# Patient Record
Sex: Male | Born: 1985 | Race: White | Hispanic: No | Marital: Married | Smoking: Never smoker
Health system: Southern US, Community
[De-identification: ages and names within clinical notes are randomized; demographics above are authoritative.]

## PROBLEM LIST (undated history)

## (undated) DIAGNOSIS — Z8042 Family history of malignant neoplasm of prostate: Secondary | ICD-10-CM

## (undated) DIAGNOSIS — M109 Gout, unspecified: Secondary | ICD-10-CM

## (undated) DIAGNOSIS — J45909 Unspecified asthma, uncomplicated: Secondary | ICD-10-CM

## (undated) DIAGNOSIS — E669 Obesity, unspecified: Secondary | ICD-10-CM

## (undated) DIAGNOSIS — E785 Hyperlipidemia, unspecified: Secondary | ICD-10-CM

## (undated) HISTORY — DX: Family history of malignant neoplasm of prostate: Z80.42

## (undated) HISTORY — DX: Unspecified asthma, uncomplicated: J45.909

## (undated) HISTORY — DX: Gout, unspecified: M10.9

## (undated) HISTORY — DX: Hyperlipidemia, unspecified: E78.5

## (undated) HISTORY — DX: Obesity, unspecified: E66.9

---
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
## 2015-09-29 ENCOUNTER — Ambulatory Visit: Payer: Self-pay | Admitting: Family

## 2015-09-30 ENCOUNTER — Ambulatory Visit: Payer: Self-pay | Admitting: Family

## 2017-09-12 ENCOUNTER — Encounter: Payer: Self-pay | Admitting: Family Medicine

## 2017-09-12 ENCOUNTER — Ambulatory Visit (INDEPENDENT_AMBULATORY_CARE_PROVIDER_SITE_OTHER): Payer: Managed Care, Other (non HMO) | Admitting: Family Medicine

## 2017-09-12 VITALS — BP 132/86 | HR 80 | Temp 98.7°F | Resp 16 | Ht 71.5 in | Wt 245.2 lb

## 2017-09-12 DIAGNOSIS — M779 Enthesopathy, unspecified: Secondary | ICD-10-CM

## 2017-09-12 DIAGNOSIS — Z23 Encounter for immunization: Secondary | ICD-10-CM | POA: Diagnosis not present

## 2017-09-12 DIAGNOSIS — Z0000 Encounter for general adult medical examination without abnormal findings: Secondary | ICD-10-CM

## 2017-09-12 DIAGNOSIS — M7752 Other enthesopathy of left foot: Secondary | ICD-10-CM

## 2017-09-12 DIAGNOSIS — M79675 Pain in left toe(s): Secondary | ICD-10-CM | POA: Diagnosis not present

## 2017-09-12 DIAGNOSIS — E669 Obesity, unspecified: Secondary | ICD-10-CM

## 2017-09-12 MED ORDER — INDOMETHACIN 50 MG PO CAPS: 50.0000 mg | ORAL_CAPSULE | Freq: Three times a day (TID) | ORAL | 1 refills | Status: DC | PRN

## 2017-09-12 NOTE — Progress Notes (Signed)
Office Note 09/12/2017  CC:  Chief Complaint  Patient presents with  . Establish Care    no recent PCP  . Toe Pain    left great toe    HPI:  Dean Thomas is a 32 y.o. male who is here to establish care and discuss L great toe pain. Patient's most recent primary MD: none Old records were not reviewed prior to or during today's visit.  Last CPE was about 1 yr ago.  Labs and CPE needed for pt's health ins.  About 5 distinct episodes in the last 1 yr of L great toe pain in IP joint.  When he does a lot of exercises involving extension of toe, esp lunges--he gets some swelling and pain and some redness in IP joint.  This episode has been going on for 2 wks or so, gradually improving. Pain at rest is minimal right now. Hurts worse when he gets up and walks around. No other joint has given him similar problem.  Past Medical History:  Diagnosis Date  . Childhood asthma   . Family history of prostate cancer in father   . Hyperlipidemia    no meds in past.  . Obesity (BMI 30.0-34.9)     Past Surgical History:  Procedure Laterality Date  . CYST EXCISION  2004   Pilonidal    Family History  Problem Relation Age of Onset  . Prostate cancer Father   . Hearing loss Father   . Hyperlipidemia Father   . Hypertension Father   . Hyperlipidemia Maternal Grandmother   . Arthritis Paternal Grandmother   . Colon cancer Paternal Grandmother     Social History   Socioeconomic History  . Marital status: Married    Spouse name: Not on file  . Number of children: Not on file  . Years of education: Not on file  . Highest education level: Not on file  Occupational History  . Not on file  Social Needs  . Financial resource strain: Not on file  . Food insecurity:    Worry: Not on file    Inability: Not on file  . Transportation needs:    Medical: Not on file    Non-medical: Not on file  Tobacco Use  . Smoking status: Never Smoker  . Smokeless tobacco: Never Used   Substance and Sexual Activity  . Alcohol use: Yes    Alcohol/week: 4.0 - 5.0 standard drinks    Types: 4 - 5 Standard drinks or equivalent per week  . Drug use: Never  . Sexual activity: Not on file  Lifestyle  . Physical activity:    Days per week: Not on file    Minutes per session: Not on file  . Stress: Not on file  Relationships  . Social connections:    Talks on phone: Not on file    Gets together: Not on file    Attends religious service: Not on file    Active member of club or organization: Not on file    Attends meetings of clubs or organizations: Not on file    Relationship status: Not on file  . Intimate partner violence:    Fear of current or ex partner: Not on file    Emotionally abused: Not on file    Physically abused: Not on file    Forced sexual activity: Not on file  Other Topics Concern  . Not on file  Social History Narrative   Married, 1 y/o daughter as of 09/2017.  Orig from [place].   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   No tobacco.   Alcohol:    Outpatient Encounter Medications as of 09/12/2017  Medication Sig  . indomethacin (INDOCIN) 50 MG capsule Take 1 capsule (50 mg total) by mouth 3 (three) times daily as needed.   No facility-administered encounter medications on file as of 09/12/2017.     No Known Allergies  ROS Review of Systems  Constitutional: Negative for fatigue and fever.  HENT: Negative for congestion and sore throat.   Eyes: Negative for visual disturbance.  Respiratory: Negative for cough.   Cardiovascular: Negative for chest pain.  Gastrointestinal: Negative for abdominal pain and nausea.  Genitourinary: Negative for dysuria.  Musculoskeletal: Positive for arthralgias (see hpi) and joint swelling. Negative for back pain and myalgias.  Skin: Negative for rash.  Neurological: Negative for weakness and headaches.  Hematological: Negative for adenopathy.    PE; Blood pressure 132/86,  pulse 80, temperature 98.7 F (37.1 C), temperature source Oral, resp. rate 16, height 5' 11.5" (1.816 m), weight 245 lb 4 oz (111.2 kg), SpO2 99 %. Body mass index is 33.73 kg/m.  Gen: Alert, well appearing.  Patient is oriented to person, place, time, and situation. AFFECT: pleasant, lucid thought and speech. CV: RRR, no m/r/g.   LUNGS: CTA bilat, nonlabored resps, good aeration in all lung fields. EXT: no clubbing or cyanosis.  no edema.  Left ankle and foot w/out swelling, erythema, or warmth. Left great toe with mild erythema of IP joint lateral aspect as well as some mild STS in IP joint.  Mild TTP over IP joint of great toe.  ROM of L great toe somewhat decreased in flexion/ext at IP joint.    Pertinent labs:  none  ASSESSMENT AND PLAN:   New pt: no old PCP records to obtain.  1) Recurrent L toe IP joint pain: capsulitis vs gouty arthritis. Given all info today, I feel like this is more likely capsulitis. Activity mod, ice, elevation, and start of trial of indocin 50mg  tid (with food) discussed. Will check uric acid when he returns for fasting HP labs at the time of his CPE soon (ordered).  An After Visit Summary was printed and given to the patient.  Return for CPE (fasting) at pt's convenience.  Signed:  Santiago Bumpers, MD           09/12/2017

## 2017-09-12 NOTE — Addendum Note (Signed)
Addended by: Smitty Knudsen on: 09/12/2017 02:56 PM   Modules accepted: Orders

## 2017-09-26 ENCOUNTER — Encounter: Payer: Self-pay | Admitting: Family Medicine

## 2017-09-27 ENCOUNTER — Encounter: Payer: Self-pay | Admitting: Family Medicine

## 2018-03-06 ENCOUNTER — Other Ambulatory Visit: Payer: Self-pay | Admitting: Physician Assistant

## 2018-03-06 ENCOUNTER — Telehealth: Payer: Self-pay | Admitting: Physician Assistant

## 2018-03-06 MED ORDER — AMOXICILLIN 875 MG PO TABS: 875.0000 mg | ORAL_TABLET | Freq: Two times a day (BID) | ORAL | 0 refills | Status: DC

## 2018-03-06 NOTE — Telephone Encounter (Signed)
Patient's wife in office today and diagnosed with strep throat.  Daughter at home with similar symptoms.  I have sent in amoxicillin 875 mg BID x 10 days for patient.  Advised to contact office if any concerning symptoms or changes in symptoms.  Jarold Motto PA-C

## 2018-06-11 ENCOUNTER — Telehealth: Payer: Self-pay

## 2018-06-11 ENCOUNTER — Ambulatory Visit (INDEPENDENT_AMBULATORY_CARE_PROVIDER_SITE_OTHER): Payer: Managed Care, Other (non HMO) | Admitting: Family Medicine

## 2018-06-11 ENCOUNTER — Encounter: Payer: Self-pay | Admitting: Family Medicine

## 2018-06-11 ENCOUNTER — Other Ambulatory Visit: Payer: Self-pay

## 2018-06-11 VITALS — Temp 97.8°F | Wt 229.0 lb

## 2018-06-11 DIAGNOSIS — M10071 Idiopathic gout, right ankle and foot: Secondary | ICD-10-CM | POA: Diagnosis not present

## 2018-06-11 MED ORDER — PREDNISONE 20 MG PO TABS: ORAL_TABLET | ORAL | 0 refills | Status: DC

## 2018-06-11 NOTE — Progress Notes (Signed)
Virtual Visit via Video Note  I connected with pt on 06/11/18 at 11:20 AM EDT by a video enabled telemedicine application and verified that I am speaking with the correct person using two identifiers.  Location patient: home Location provider:work or home office Persons participating in the virtual visit: patient, provider  I discussed the limitations of evaluation and management by telemedicine and the availability of in person appointments. The patient expressed understanding and agreed to proceed.  Telemedicine visit is a necessity given the COVID-19 restrictions in place at the current time.  HPI: 33 y/o WM being seen today for right foot pain for the last 7d. (He had been having L great toe PIP pain when I initially saw him back in 09/2017 and I wasn't sure whether he was having capsulitis or gout.  Treated with indomethacin.  I ordered a fasting HP labs + uric acid to be done at his CPE but he did not return for that visit.)  UPDATE: Severe R MTP joint pain onset 7 d/a, swelling, decreased ROM.  Very sensitive to palpation. Trying to rest it, hydrate well.  Remainder of foot and his ankle are not painful. Indocin not very helpful, sx's worse this morning.  No fever/chills/malaise. Good appetite and energy level. No other joints bother him.  No recent injury.  FH: brother with gout.   ROS: See pertinent positives and negatives per HPI.  Past Medical History:  Diagnosis Date  . Childhood asthma   . Family history of prostate cancer in father   . Hyperlipidemia    no meds in past.  . Obesity (BMI 30.0-34.9)     Past Surgical History:  Procedure Laterality Date  . CYST EXCISION  2004   Pilonidal    Family History  Problem Relation Age of Onset  . Prostate cancer Father   . Hearing loss Father   . Hyperlipidemia Father   . Hypertension Father   . Hyperlipidemia Maternal Grandmother   . Arthritis Paternal Grandmother   . 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 No T/A/Ds.   Current Outpatient Medications:  .  ibuprofen (ADVIL) 600 MG tablet, Take 600 mg by mouth every 6 (six) hours as needed., Disp: , Rfl:  .  indomethacin (INDOCIN) 50 MG capsule, Take 1 capsule (50 mg total) by mouth 3 (three) times daily as needed., Disp: 30 capsule, Rfl: 1  EXAM:  VITALS per patient if applicable: Temp 97.8 F (36.6 C) (Oral)   Wt 229 lb (103.9 kg)   BMI 31.49 kg/m    GENERAL: alert, oriented, appears well and in no acute distress  HEENT: atraumatic, conjunttiva clear, no obvious abnormalities on inspection of external nose and ears  NECK: normal movements of the head and neck  LUNGS: on inspection no signs of respiratory distress, breathing rate appears normal, no obvious gross SOB, gasping or wheezing  CV: no obvious cyanosis  MS: moves all visible extremities without noticeable abnormality except R foot 1st MTP joint with mild swelling visible.  No erythema but mild hyperpigmentation appearance over R foot first MTP jt.   PSYCH/NEURO: pleasant and cooperative, no obvious depression or anxiety, speech and thought processing grossly intact  LABS: none today  [facility] results found for: [facility]   Chemistry   No results found for: NA, K, CL, CO2, BUN, CREATININE, GLU No results found for: CALCIUM, ALKPHOS, AST, ALT, BILITOT   No results found for: WBC,  HGB, HCT, MCV, PLT  ASSESSMENT AND PLAN:  Discussed the following assessment and plan:  1) Acute gouty arthritis right MTP jt: Plan is-> prednisone 60mg  qd x 2d, then 40mg  qd x 2d, then 20mg  qd x 2d. No NSAIDs while on prednisone. Discussed approach to gout prevention with diet +/- daily medication. Pt expressed understanding and we chose to NOT start any prophylactic med at this time. Will check uric acid level at future CPE when we do fasting HP labs.  Signs/symptoms to call or return for were reviewed and pt expressed  understanding.   I discussed the assessment and treatment plan with the patient. The patient was provided an opportunity to ask questions and all were answered. The patient agreed with the plan and demonstrated an understanding of the instructions.   The patient was advised to call back or seek an in-person evaluation if the symptoms worsen or if the condition fails to improve as anticipated.  F/u: prn (CPE when he wants).  Signed:  Santiago Bumpers, MD           06/11/2018

## 2018-06-11 NOTE — Telephone Encounter (Signed)
Pt has been scheduled today for late morning with PCP.  Copied from CRM [id number]. Topic: Appointment Scheduling - Scheduling Inquiry for Clinic >> Jun 11, 2018  7:29 AM Leafy Ro wrote: Pt is having a gout flare up in right foot for 1 wk today and would like an appt

## 2018-07-15 ENCOUNTER — Telehealth: Payer: Self-pay

## 2018-07-15 NOTE — Telephone Encounter (Signed)
Pt was called and placed on Dr Isla Pence schedule.

## 2018-07-17 ENCOUNTER — Ambulatory Visit (INDEPENDENT_AMBULATORY_CARE_PROVIDER_SITE_OTHER): Payer: Managed Care, Other (non HMO) | Admitting: Family Medicine

## 2018-07-17 ENCOUNTER — Other Ambulatory Visit: Payer: Self-pay

## 2018-07-17 ENCOUNTER — Encounter: Payer: Self-pay | Admitting: Family Medicine

## 2018-07-17 VITALS — BP 124/85 | HR 76 | Temp 98.4°F | Resp 16 | Ht 71.5 in | Wt 235.2 lb

## 2018-07-17 DIAGNOSIS — M928 Other specified juvenile osteochondrosis: Secondary | ICD-10-CM | POA: Diagnosis not present

## 2018-07-17 DIAGNOSIS — M79674 Pain in right toe(s): Secondary | ICD-10-CM

## 2018-07-17 LAB — COMPREHENSIVE METABOLIC PANEL
ALT: 29 U/L (ref 0–53)
AST: 17 U/L (ref 0–37)
Albumin: 5.2 g/dL (ref 3.5–5.2)
Alkaline Phosphatase: 71 U/L (ref 39–117)
BUN: 15 mg/dL (ref 6–23)
CO2: 27 mEq/L (ref 19–32)
Calcium: 9.9 mg/dL (ref 8.4–10.5)
Chloride: 102 mEq/L (ref 96–112)
Creatinine, Ser: 0.95 mg/dL (ref 0.40–1.50)
GFR: 90.77 mL/min (ref >60.00)
Glucose, Bld: 99 mg/dL (ref 70–99)
Potassium: 4.8 mEq/L (ref 3.5–5.1)
Sodium: 141 mEq/L (ref 135–145)
Total Bilirubin: 0.9 mg/dL (ref 0.2–1.2)
Total Protein: 7.6 g/dL (ref 6.0–8.3)

## 2018-07-17 LAB — CBC WITH DIFFERENTIAL/PLATELET
Basophils Absolute: 0 10*3/uL (ref 0.0–0.1)
Basophils Relative: 0.5 % (ref 0.0–3.0)
Eosinophils Absolute: 0.1 10*3/uL (ref 0.0–0.7)
Eosinophils Relative: 1.3 % (ref 0.0–5.0)
HCT: 47.1 % (ref 39.0–52.0)
Hemoglobin: 16.1 g/dL (ref 13.0–17.0)
Lymphocytes Relative: 42.6 % (ref 12.0–46.0)
Lymphs Abs: 2.5 10*3/uL (ref 0.7–4.0)
MCHC: 34.2 g/dL (ref 30.0–36.0)
MCV: 89.4 fl (ref 78.0–100.0)
Monocytes Absolute: 0.4 10*3/uL (ref 0.1–1.0)
Monocytes Relative: 7.5 % (ref 3.0–12.0)
Neutro Abs: 2.9 10*3/uL (ref 1.4–7.7)
Neutrophils Relative %: 48.1 % (ref 43.0–77.0)
Platelets: 192 10*3/uL (ref 150.0–400.0)
RBC: 5.27 Mil/uL (ref 4.22–5.81)
RDW: 12.5 % (ref 11.5–15.5)
WBC: 5.9 10*3/uL (ref 4.0–10.5)

## 2018-07-17 LAB — SEDIMENTATION RATE: Sed Rate: 3 mm/hr (ref 0–15)

## 2018-07-17 LAB — URIC ACID: Uric Acid, Serum: 8.5 mg/dL — ABNORMAL HIGH (ref 4.0–7.8)

## 2018-07-17 NOTE — Progress Notes (Signed)
OFFICE VISIT  07/17/2018   CC:  Chief Complaint  Patient presents with  . Follow-up    gout, still having issues walking on his right foot    HPI:    Patient is a 33 y.o.  male who presents for 5 week f/u arthritis of R MTP joint that I suspected was gout and treated with prednisone. No uric acid has been done (no labs at all have been done)-->we had planned on getting these done at a future CPE visit.  Prednisone helped take the acute pain away, but he remains with some intermittent mild waxing/waning pain, particularly made worse by running.  The area of pain is still the R MTP jt and R 1st toe IP joint---the great toe diffusely.  "Feels like running on a rock".  Still with some intermittent R big toe erythema and swelling-->particularly laterally.  Also having a week or two of R achilles pain, esp where it inserts into calcaneus.  This bothers him mostly after he runs.  Currently his ankle, foot, and toes feel pretty normal.  Past Medical History:  Diagnosis Date  . Childhood asthma   . Family history of prostate cancer in father   . Hyperlipidemia    no meds in past.  . Obesity (BMI 30.0-34.9)     Past Surgical History:  Procedure Laterality Date  . CYST EXCISION  2004   Pilonidal    Outpatient Medications Prior to Visit  Medication Sig Dispense Refill  . ibuprofen (ADVIL) 600 MG tablet Take 600 mg by mouth every 6 (six) hours as needed.    . indomethacin (INDOCIN) 50 MG capsule Take 1 capsule (50 mg total) by mouth 3 (three) times daily as needed. (Patient not taking: Reported on 07/17/2018) 30 capsule 1  . predniSONE (DELTASONE) 20 MG tablet 3 tabs po qd x 2d, then 2 tabs po qd x 2d, then 1 tab po qd x 2d 12 tablet 0   No facility-administered medications prior to visit.     No Known Allergies  ROS As per HPI  PE: Blood pressure 124/85, pulse 76, temperature 98.4 F (36.9 C), temperature source Temporal, resp. rate 16, height 5' 11.5" (1.816 m), weight 235 lb  3.2 oz (106.7 kg), SpO2 100 %. Gen: Alert, well appearing.  Patient is oriented to person, place, time, and situation. AFFECT: pleasant, lucid thought and speech. R ankle and foot without erythema or swelling.  ROM of ankle/foot/toes all intact and w/out pain. I see no focal swelling or erythema about the great toe MTP.  He has some focal tenderness on distal aspect of 1st metatarsal bone on lateral aspect.  He has mild R achilles tendon TTP distally, with signif TTP where tendon inserts on calcaneous.  No swelling or erythema.  Resisted flexion and extension of R ankle does not elicit any achilles pain.  LABS:  No results found for: TSH No results found for: WBC, HGB, HCT, MCV, PLT No results found for: CREATININE, BUN, NA, K, CL, CO2 No results found for: ALT, AST, GGT, ALKPHOS, BILITOT No results found for: CHOL No results found for: HDL No results found for: LDLCALC No results found for: TRIG No results found for: CHOLHDL No results found for: PSA No results found for: LABURIC No results found for: HGBA1C   IMPRESSION AND PLAN:  1) Right great toe pain, not entirely convincing for gouty arthritis. He has some residual discomfort intermittently that I think may be more ligamentous or tendonous, not gout. Today his  foot exam is almost entirely normal. Plan: X-ray R great toe. Check CBC, CMET, Uric acid, ESR. Refer to sports med MD for expert evaluation.  He has some mild achilles tendonitis/apophysitis that I think is a separate issue, possibly from altered gait from his toe pain.  An After Visit Summary was printed and given to the patient.  FOLLOW UP: Return for as needed.  Signed:  Crissie Sickles, MD           07/17/2018

## 2018-08-11 NOTE — Progress Notes (Signed)
[facility] [address], [place] [address] Phone: [phone] Subjective:   I Dean Thomas am serving as a Education administrator for Dr. Hulan Saas.  I'm seeing this patient by the request  of:  McGowen, Adrian Blackwater, MD   CC: Toe pain  HAL:[id number]  Dean Thomas is a 33 y.o. male coming in with complaint of right great toe pain. States it feels as if he is walking on a rock. Achilles weakness and heel pain. Toe has swollen up on him before. PCP thought it was gout. Bunion on the medial great toe.  Location - great toe, ankle, achilles  Character- dull aching.  Aggravating factors- walking, stairs, running Reliving factors- ice for the toe, ibuprofen, elevation  Therapies tried-  Severity-8/10 immediately      Past Medical History:  Diagnosis Date  . Childhood asthma   . Family history of prostate cancer in father   . Hyperlipidemia    no meds in past.  . Obesity (BMI 30.0-34.9)    Past Surgical History:  Procedure Laterality Date  . CYST EXCISION  2004   Pilonidal   Social History   Socioeconomic History  . Marital status: Married    Spouse name: Not on file  . Number of children: Not on file  . Years of education: Not on file  . Highest education level: Not on file  Occupational History  . Not on file  Social Needs  . Financial resource strain: Not on file  . Food insecurity    Worry: Not on file    Inability: Not on file  . Transportation needs    Medical: Not on file    Non-medical: Not on file  Tobacco Use  . Smoking status: Never Smoker  . Smokeless tobacco: Never Used  Substance and Sexual Activity  . Alcohol use: Yes    Alcohol/week: 4.0 - 5.0 standard drinks    Types: 4 - 5 Standard drinks or equivalent per week  . Drug use: Never  . Sexual activity: Not on file  Lifestyle  . Physical activity    Days per week: Not on file    Minutes per session: Not on file  . Stress: Not on file  Relationships  . Social  Herbalist on phone: Not on file    Gets together: Not on file    Attends religious service: Not on file    Active member of club or organization: Not on file    Attends meetings of clubs or organizations: Not on file    Relationship status: Not on file  Other Topics Concern  . Not on file  Social History Narrative   Married, 32 y/o daughter as of 09/2017.   Orig from [place].   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   No tobacco.   Alcohol:   No Known Allergies Family History  Problem Relation Age of Onset  . Prostate cancer Father   . Hearing loss Father   . Hyperlipidemia Father   . Hypertension Father   . Hyperlipidemia Maternal Grandmother   . Arthritis Paternal Grandmother   . Colon cancer Paternal Grandmother        Current Outpatient Medications (Analgesics):  .  ibuprofen (ADVIL) 600 MG tablet, Take 600 mg by mouth every 6 (six) hours as needed.   Current Outpatient Medications (Other):  Marland Kitchen  Vitamin D, Ergocalciferol, (DRISDOL) 1.25 MG (50000 UT) CAPS  capsule, Take 1 capsule (50,000 Units total) by mouth every 7 (seven) days.    Past medical history, social, surgical and family history all reviewed in electronic medical record.  No pertanent information unless stated regarding to the chief complaint.   Review of Systems:  No headache, visual changes, nausea, vomiting, diarrhea, constipation, dizziness, abdominal pain, skin rash, fevers, chills, night sweats, weight loss, swollen lymph nodes, body aches, joint swelling, muscle aches, chest pain, shortness of breath, mood changes.   Objective  Blood pressure (!) 144/82, pulse 69, height 5\' 11"  (1.803 m), weight 243 lb (110.2 kg), SpO2 98 %.   General: No apparent distress alert and oriented x3 mood and affect normal, dressed appropriately.  HEENT: Pupils equal, extraocular movements intact  Respiratory: Patient's speak in full sentences and does not appear short of  breath  Cardiovascular: No lower extremity edema, non tender, no erythema  Skin: Warm dry intact with no signs of infection or rash on extremities or on axial skeleton.  Abdomen: Soft nontender  Neuro: Cranial nerves II through XII are intact, neurovascularly intact in all extremities with 2+ DTRs and 2+ pulses.  Lymph: No lymphadenopathy of posterior or anterior cervical chain or axillae bilaterally.  Gait normal with good balance and coordination.  MSK:  tender with full range of motion and good stability and symmetric strength and tone of shoulders, elbows, wrist, hip, knee and ankles bilaterally.  Right foot exam shows the patient does have significant splaying between the first and second toes.  Patient does have bunion versus tophi formation noted over the medial aspect of the first MTP.  Mild hallux limitus noted patient does have some tightness of the Achilles noted.  Limited musculoskeletal ultrasound was performed and interpreted by Dean Thomas  Limited ultrasound shows the patient does have synovitis noted with hypoechoic changes within the first metatarsal.  A significant amount of calcific versus uric acid deposits noted in the toe as well. Impression: Synovitis with mild arthritis and possible uric acid deposits  97110; 15 additional minutes spent for Therapeutic exercises as stated in above notes.  This included exercises focusing on stretching, strengthening, with significant focus on eccentric aspects.   Long term goals include an improvement in range of motion, strength, endurance as well as avoiding reinjury. Patient's frequency would include in 1-2 times a day, 3-5 times a week for a duration of 6-12 weeks. Exercises for the foot include:  Stretches to help lengthen the lower leg and plantar fascia areas Theraband exercises for the lower leg and ankle to help strengthen the surrounding area- dorsiflexion, plantarflexion, inversion, eversion Massage rolling on the plantar  surface of the foot with a frozen bottle, tennis ball or golf ball Towel or marble pick-ups to strengthen the plantar surface of the foot Weight bearing exercises to increase balance and overall stability   Proper technique shown and discussed handout in great detail with ATC.  All questions were discussed and answered.      Impression and Recommendations:     This case required medical decision making of moderate complexity. The above documentation has been reviewed and is accurate and complete Dean SaaZachary M Topher Buenaventura, DO       Note: This dictation was prepared with Dragon dictation along with smaller phrase technology. Any transcriptional errors that result from this process are unintentional.

## 2018-08-12 ENCOUNTER — Encounter: Payer: Self-pay | Admitting: Family Medicine

## 2018-08-12 ENCOUNTER — Other Ambulatory Visit: Payer: Self-pay

## 2018-08-12 ENCOUNTER — Ambulatory Visit: Payer: Self-pay

## 2018-08-12 ENCOUNTER — Ambulatory Visit (INDEPENDENT_AMBULATORY_CARE_PROVIDER_SITE_OTHER): Payer: Managed Care, Other (non HMO) | Admitting: Family Medicine

## 2018-08-12 VITALS — BP 144/82 | HR 69 | Ht 71.0 in | Wt 243.0 lb

## 2018-08-12 DIAGNOSIS — M79674 Pain in right toe(s): Secondary | ICD-10-CM

## 2018-08-12 DIAGNOSIS — M216X9 Other acquired deformities of unspecified foot: Secondary | ICD-10-CM | POA: Insufficient documentation

## 2018-08-12 DIAGNOSIS — M659 Synovitis and tenosynovitis, unspecified: Secondary | ICD-10-CM | POA: Diagnosis not present

## 2018-08-12 DIAGNOSIS — M216X1 Other acquired deformities of right foot: Secondary | ICD-10-CM

## 2018-08-12 DIAGNOSIS — M1A2711 Drug-induced chronic gout, right ankle and foot, with tophus (tophi): Secondary | ICD-10-CM | POA: Diagnosis not present

## 2018-08-12 DIAGNOSIS — M109 Gout, unspecified: Secondary | ICD-10-CM | POA: Insufficient documentation

## 2018-08-12 MED ORDER — VITAMIN D (ERGOCALCIFEROL) 1.25 MG (50000 UNIT) PO CAPS: 50000.0000 [IU] | ORAL_CAPSULE | ORAL | 0 refills | Status: DC

## 2018-08-12 NOTE — Assessment & Plan Note (Addendum)
Patient does have some synovitis of the right toe.  It does appear that patient does have some uric acid deposits are still noted.  This is concerning for underlying gout especially with a uric acid level of 8.52 weeks after the flare.  We discussed potential prescription medications which patient declined.  Patient will start with over-the-counter medications, icing regimen, which activities to do which wants to avoid.  We discussed proper shoes and over-the-counter orthotics, topical anti-inflammatories, if continues have pain consider injections and potentially custom orthotics. 5-6 week follow up

## 2018-08-12 NOTE — Patient Instructions (Signed)
Good to see you 1/16th of an inch heel lift on right side Exercises 3 times a week.  Once weekly vitamin D for 12 weeks.  Tart cherry extract 1200mg  at night Spenco orthotics "total support"  Hoka bondy 56 or carbon x In house oofos  See me again in 6 weeks

## 2018-09-07 ENCOUNTER — Encounter: Payer: Self-pay | Admitting: Family Medicine

## 2018-09-09 ENCOUNTER — Telehealth: Payer: Self-pay | Admitting: Family Medicine

## 2018-09-09 MED ORDER — INDOMETHACIN 50 MG PO CAPS: ORAL_CAPSULE | ORAL | 1 refills | Status: DC

## 2018-09-09 NOTE — Telephone Encounter (Signed)
Medication not on current medication list. 

## 2018-09-09 NOTE — Telephone Encounter (Signed)
Yes ok to refill 

## 2018-09-09 NOTE — Telephone Encounter (Signed)
Medication Refill - Medication: indomethacin (INDOCIN) 50 MG capsule    Has the patient contacted their pharmacy? Yes.   Pt called stating he is having a gout flare up and that he has no more of this medication. Please advise.  (Agent: If no, request that the patient contact the pharmacy for the refill.) (Agent: If yes, when and what did the pharmacy advise?)  Preferred Pharmacy (with phone number or street name):  [id number] - [place], [address]  [place] [address]  Phone: [phone] Fax: [phone]  Not a 24 hour pharmacy; exact hours not known.     Agent: Please be advised that RX refills may take up to 3 business days. We ask that you follow-up with your pharmacy.

## 2018-09-09 NOTE — Telephone Encounter (Signed)
Rx sent same as last, # 30 x 1 refill with instructions to take 1 capsule 3 times daily, PRN.

## 2018-09-09 NOTE — Telephone Encounter (Signed)
RF request for indocin.  Patient was taking off this medication 09/12/5857 due to uncertainty of great toe pain being from Gout.  Please advise if this rf is appropriate.

## 2018-09-25 ENCOUNTER — Encounter: Payer: Self-pay | Admitting: Family Medicine

## 2018-09-25 ENCOUNTER — Ambulatory Visit: Payer: Self-pay

## 2018-09-25 ENCOUNTER — Ambulatory Visit (INDEPENDENT_AMBULATORY_CARE_PROVIDER_SITE_OTHER): Payer: Managed Care, Other (non HMO) | Admitting: Family Medicine

## 2018-09-25 ENCOUNTER — Other Ambulatory Visit: Payer: Self-pay

## 2018-09-25 VITALS — BP 120/82 | HR 69 | Ht 71.0 in | Wt 243.0 lb

## 2018-09-25 DIAGNOSIS — M79674 Pain in right toe(s): Secondary | ICD-10-CM

## 2018-09-25 DIAGNOSIS — M659 Synovitis and tenosynovitis, unspecified: Secondary | ICD-10-CM | POA: Diagnosis not present

## 2018-09-25 MED ORDER — ALLOPURINOL 100 MG PO TABS: 200.0000 mg | ORAL_TABLET | Freq: Every day | ORAL | 3 refills | Status: DC

## 2018-09-25 NOTE — Assessment & Plan Note (Signed)
Patient still has some residual capsulitis noted of the toe that is still based secondary to more than gout I think.  Patient declined an injection today.  Started on allopurinol secondary to the elevated uric acid.  Which consider rechecking the uric acid at follow-up when patient follows up again in 2 months.  Continue good shoes and home exercises.

## 2018-09-25 NOTE — Patient Instructions (Addendum)
  Allopurinol 200mg  daily Endomeocin take 2x daily for next 3 days See me again in 6 weeks

## 2018-09-25 NOTE — Progress Notes (Signed)
[facility] [address] [place], [place] [address] Phone: [phone] Subjective:    I'm seeing this patient by the request  of:    CC: Right toe pain follow-up  [id number]   08/12/2018 Patient does have some synovitis of the right toe.  It does appear that patient does have some uric acid deposits are still noted.  This is concerning for underlying gout especially with a uric acid level of 8.52 weeks after the flare.  We discussed potential prescription medications which patient declined.  Patient will start with over-the-counter medications, icing regimen, which activities to do which wants to avoid.  We discussed proper shoes and over-the-counter orthotics, topical anti-inflammatories, if continues have pain consider injections and potentially custom orthotics. 5-6 week follow up   Update 09/25/2018 Dean Thomas is a 33 y.o. male coming in with complaint of that he has been having pain but it is not like it was when he first came in. Did have a flare 2 weeks ago that last 3 days. Is running and has been using [employer]OK[employer] carbon shoes which have helped.      Past Medical History:  Diagnosis Date  . Childhood asthma   . Family history of prostate cancer in father   . Gout   . Hyperlipidemia    no meds in past.  . Obesity (BMI 30.0-34.9)    Past Surgical History:  Procedure Laterality Date  . CYST EXCISION  2004   Pilonidal   Social History   Socioeconomic History  . Marital status: Married    Spouse name: Not on file  . Number of children: Not on file  . Years of education: Not on file  . Highest education level: Not on file  Occupational History  . Not on file  Social Needs  . Financial resource strain: Not on file  . Food insecurity    Worry: Not on file    Inability: Not on file  . Transportation needs    Medical: Not on file    Non-medical: Not on file  Tobacco Use  . Smoking status: Never Smoker  . Smokeless tobacco: Never Used   Substance and Sexual Activity  . Alcohol use: Yes    Alcohol/week: 4.0 - 5.0 standard drinks    Types: 4 - 5 Standard drinks or equivalent per week  . Drug use: Never  . Sexual activity: Not on file  Lifestyle  . Physical activity    Days per week: Not on file    Minutes per session: Not on file  . Stress: Not on file  Relationships  . Social Herbalist on phone: Not on file    Gets together: Not on file    Attends religious service: Not on file    Active member of club or organization: Not on file    Attends meetings of clubs or organizations: Not on file    Relationship status: Not on file  Other Topics Concern  . Not on file  Social History Narrative   Married, 60 y/o daughter as of 09/2017.   Orig from [place].   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   No tobacco.   Alcohol:   No Known Allergies Family History  Problem Relation Age of Onset  . Prostate cancer Father   . Hearing loss Father   . Hyperlipidemia Father   . Hypertension Father   . Hyperlipidemia Maternal Grandmother   .  Arthritis Paternal Grandmother   . Colon cancer Paternal Grandmother        Current Outpatient Medications (Analgesics):  .  ibuprofen (ADVIL) 600 MG tablet, Take 600 mg by mouth every 6 (six) hours as needed. .  indomethacin (INDOCIN) 50 MG capsule, Take 1 capsule by mouth 3 times daily, as needed. Marland Kitchen.  allopurinol (ZYLOPRIM) 100 MG tablet, Take 2 tablets (200 mg total) by mouth daily.   Current Outpatient Medications (Other):  Marland Kitchen.  Vitamin D, Ergocalciferol, (DRISDOL) 1.25 MG (50000 UT) CAPS capsule, Take 1 capsule (50,000 Units total) by mouth every 7 (seven) days.    Past medical history, social, surgical and family history all reviewed in electronic medical record.  No pertanent information unless stated regarding to the chief complaint.   Review of Systems:  No headache, visual changes, nausea, vomiting, diarrhea, constipation,  dizziness, abdominal pain, skin rash, fevers, chills, night sweats, weight loss, swollen lymph nodes, body aches, joint swelling, muscle aches, chest pain, shortness of breath, mood changes.   Objective  Blood pressure 120/82, pulse 69, height 5\' 11"  (1.803 m), weight 243 lb (110.2 kg), SpO2 99 %.    General: No apparent distress alert and oriented x3 mood and affect normal, dressed appropriately.  HEENT: Pupils equal, extraocular movements intact  Respiratory: Patient's speak in full sentences and does not appear short of breath  Cardiovascular: No lower extremity edema, non tender, no erythema  Skin: Warm dry intact with no signs of infection or rash on extremities or on axial skeleton.  Abdomen: Soft nontender  Neuro: Cranial nerves II through XII are intact, neurovascularly intact in all extremities with 2+ DTRs and 2+ pulses.  Lymph: No lymphadenopathy of posterior or anterior cervical chain or axillae bilaterally.  Gait antalgic MSK:  tender with full range of motion and good stability and symmetric strength and tone of shoulders, elbows, wrist, hip, knee and ankles bilaterally.  Right foot exam shows the patient still has some mild swelling of the first metatarsal joint.  Limited dorsiflexion of the toe.  Breakdown the transverse arch pneumonia.  Limited musculoskeletal ultrasound was performed and interpreted by Judi SaaZachary M Shakela Donati  Limited ultrasound of patient's first toe still shows the patient has a synovitis noted.  Seems to be compressible.  Still has double line that is consistent with some uric acid deposits in the surrounding area and as well as in the tendon sheath of the extensor tendon.    Impression and Recommendations:     This case required medical decision making of moderate complexity. The above documentation has been reviewed and is accurate and complete Judi SaaZachary M Kenneth Cuaresma, DO       Note: This dictation was prepared with Dragon dictation along with smaller phrase  technology. Any transcriptional errors that result from this process are unintentional.

## 2018-10-03 ENCOUNTER — Telehealth: Payer: Self-pay | Admitting: *Deleted

## 2018-10-03 NOTE — Telephone Encounter (Signed)
Bump up to 300mg  daily

## 2018-10-03 NOTE — Telephone Encounter (Signed)
Pt left msg stating that he has been taking the allopurinol as prescribed but the gout in his great toe has not improved. What do you recommend?

## 2018-10-04 NOTE — Telephone Encounter (Signed)
Discussed with pt

## 2018-10-24 NOTE — Telephone Encounter (Signed)
Pt left msg stating that he has increased his allopurinol to 300mg  daily and he is still having gout flare-ups and they are happening more frequently. Please advise on what to do next.

## 2018-10-28 ENCOUNTER — Other Ambulatory Visit: Payer: Self-pay | Admitting: Family Medicine

## 2018-11-04 ENCOUNTER — Encounter: Payer: Self-pay | Admitting: Family Medicine

## 2018-11-12 NOTE — Progress Notes (Deleted)
[facility] [address] [place], [place] [address] Phone: [phone] Subjective:    I'm seeing this patient by the request  of:    CC:   [id number]  Dean Thomas is a 33 y.o. male coming in with complaint of ***  Onset-  Location Duration-  Character- Aggravating factors- Reliving factors-  Therapies tried-  Severity-     Past Medical History:  Diagnosis Date  . Childhood asthma   . Family history of prostate cancer in father   . Gout    Dr. Tamala Julian started allopurinol 10/2018  . Hyperlipidemia    no meds in past.  . Obesity (BMI 30.0-34.9)    Past Surgical History:  Procedure Laterality Date  . CYST EXCISION  2004   Pilonidal   Social History   Socioeconomic History  . Marital status: Married    Spouse name: Not on file  . Number of children: Not on file  . Years of education: Not on file  . Highest education level: Not on file  Occupational History  . Not on file  Social Needs  . Financial resource strain: Not on file  . Food insecurity    Worry: Not on file    Inability: Not on file  . Transportation needs    Medical: Not on file    Non-medical: Not on file  Tobacco Use  . Smoking status: Never Smoker  . Smokeless tobacco: Never Used  Substance and Sexual Activity  . Alcohol use: Yes    Alcohol/week: 4.0 - 5.0 standard drinks    Types: 4 - 5 Standard drinks or equivalent per week  . Drug use: Never  . Sexual activity: Not on file  Lifestyle  . Physical activity    Days per week: Not on file    Minutes per session: Not on file  . Stress: Not on file  Relationships  . Social Herbalist on phone: Not on file    Gets together: Not on file    Attends religious service: Not on file    Active member of club or organization: Not on file    Attends meetings of clubs or organizations: Not on file    Relationship status: Not on file  Other Topics Concern  . Not on file  Social History Narrative    Married, 38 y/o daughter as of 09/2017.   Orig from [place].   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   No tobacco.   Alcohol:   No Known Allergies Family History  Problem Relation Age of Onset  . Prostate cancer Father   . Hearing loss Father   . Hyperlipidemia Father   . Hypertension Father   . Hyperlipidemia Maternal Grandmother   . Arthritis Paternal Grandmother   . Colon cancer Paternal Grandmother        Current Outpatient Medications (Analgesics):  .  allopurinol (ZYLOPRIM) 100 MG tablet, Take 2 tablets (200 mg total) by mouth daily. Marland Kitchen  ibuprofen (ADVIL) 600 MG tablet, Take 600 mg by mouth every 6 (six) hours as needed. .  indomethacin (INDOCIN) 50 MG capsule, Take 1 capsule by mouth 3 times daily, as needed.   Current Outpatient Medications (Other):  Marland Kitchen  Vitamin D, Ergocalciferol, (DRISDOL) 1.25 MG (50000 UT) CAPS capsule, TAKE 1 CAPSULE (50,000 UNITS TOTAL) BY MOUTH EVERY 7 (SEVEN) DAYS.    Past medical history, social, surgical and family history all reviewed  in electronic medical record.  No pertanent information unless stated regarding to the chief complaint.   Review of Systems:  No headache, visual changes, nausea, vomiting, diarrhea, constipation, dizziness, abdominal pain, skin rash, fevers, chills, night sweats, weight loss, swollen lymph nodes, body aches, joint swelling, muscle aches, chest pain, shortness of breath, mood changes.   Objective  There were no vitals taken for this visit. Systems examined below as of    General: No apparent distress alert and oriented x3 mood and affect normal, dressed appropriately.  HEENT: Pupils equal, extraocular movements intact  Respiratory: Patient's speak in full sentences and does not appear short of breath  Cardiovascular: No lower extremity edema, non tender, no erythema  Skin: Warm dry intact with no signs of infection or rash on extremities or on axial skeleton.  Abdomen:  Soft nontender  Neuro: Cranial nerves II through XII are intact, neurovascularly intact in all extremities with 2+ DTRs and 2+ pulses.  Lymph: No lymphadenopathy of posterior or anterior cervical chain or axillae bilaterally.  Gait normal with good balance and coordination.  MSK:  Non tender with full range of motion and good stability and symmetric strength and tone of shoulders, elbows, wrist, hip, knee and ankles bilaterally.     Impression and Recommendations:     This case required medical decision making of moderate complexity. The above documentation has been reviewed and is accurate and complete Judi Saa, DO       Note: This dictation was prepared with Dragon dictation along with smaller phrase technology. Any transcriptional errors that result from this process are unintentional.

## 2018-11-13 ENCOUNTER — Ambulatory Visit: Payer: Managed Care, Other (non HMO) | Admitting: Family Medicine

## 2018-11-22 ENCOUNTER — Telehealth: Payer: Self-pay | Admitting: *Deleted

## 2018-11-22 NOTE — Telephone Encounter (Signed)
Left message for patient to call back to schedule visit with Dr. Smith.  

## 2018-11-22 NOTE — Telephone Encounter (Signed)
Pt left msg stating since increasing allopurinol to 300mg  qd he is not having as many gout flares. However his R foot is still continuing to bother him.

## 2018-11-26 ENCOUNTER — Other Ambulatory Visit: Payer: Self-pay

## 2018-11-26 ENCOUNTER — Encounter: Payer: Self-pay | Admitting: Family Medicine

## 2018-11-26 ENCOUNTER — Ambulatory Visit (INDEPENDENT_AMBULATORY_CARE_PROVIDER_SITE_OTHER): Payer: Managed Care, Other (non HMO)

## 2018-11-26 ENCOUNTER — Ambulatory Visit (INDEPENDENT_AMBULATORY_CARE_PROVIDER_SITE_OTHER): Payer: Managed Care, Other (non HMO) | Admitting: Family Medicine

## 2018-11-26 VITALS — BP 124/76 | HR 74 | Ht 71.0 in | Wt 247.8 lb

## 2018-11-26 DIAGNOSIS — M79671 Pain in right foot: Secondary | ICD-10-CM

## 2018-11-26 DIAGNOSIS — M1A9XX0 Chronic gout, unspecified, without tophus (tophi): Secondary | ICD-10-CM | POA: Diagnosis not present

## 2018-11-26 DIAGNOSIS — M7989 Other specified soft tissue disorders: Secondary | ICD-10-CM | POA: Diagnosis not present

## 2018-11-26 DIAGNOSIS — M25571 Pain in right ankle and joints of right foot: Secondary | ICD-10-CM

## 2018-11-26 DIAGNOSIS — Z5181 Encounter for therapeutic drug level monitoring: Secondary | ICD-10-CM

## 2018-11-26 MED ORDER — COLCHICINE 0.6 MG PO TABS: 0.6000 mg | ORAL_TABLET | Freq: Every day | ORAL | 2 refills | Status: DC

## 2018-11-26 NOTE — Progress Notes (Signed)
Foot x-ray shows no fracture no significant arthritis.  No bony erosions consistent with rheumatoid arthritis present.

## 2018-11-26 NOTE — Patient Instructions (Signed)
Thank you for coming in today. I will get results back to you soon.  Take colchicine daily for 1-3 months to prevent gout flairs.  Use cam walker boot as needed for pain.

## 2018-11-26 NOTE — Addendum Note (Signed)
Addended by: Francis Dowse T on: 11/26/2018 02:44 PM   Modules accepted: Orders

## 2018-11-26 NOTE — Progress Notes (Signed)
Ankle x-ray shows no fractures.  No significant arthritis.

## 2018-11-26 NOTE — Progress Notes (Signed)
I, Christoper Fabian, LAT, ATC, am serving as scribe for Dr. Clementeen Graham.  Dean Thomas is a 33 y.o. male who presents to [facility] today for right great toe pain.  Patient is seen my partner Dr. Terrilee Files initially in August and subsequently in September for right great toe pain.  Pain was thought to be initially synovitis but then uric acid was found to be elevated and 8.5 and pain is also thought to be due to gout.  He has been treated so far with allopurinol 200 mg daily along with indomethacin as needed.  He was last seen for this September 16.  In the interim he notes increased "attacks" since starting the Allopurinol.  He notes that he woke up last Wednesday night and felt like his ankle was broken.  He reports that he initially had no swelling but then swelling developed on Friday.  He notes that he was having pain w/ walking over the weekend and still doesn't feel that he's walking normally.  He did notice some redness along the R anterior ankle that ran across the top of his R foot but denies any pain or redness in his R great toe.  He also reports some swelling and stiffness in his B hands.  Pt denies any numbness/tingling.    ROS:  As above  Exam:  BP 124/76 (BP Location: Left Arm, Patient Position: Sitting, Cuff Size: Large)   Pulse 74   Ht 5\' 11"  (1.803 m)   Wt 247 lb 12.8 oz (112.4 kg)   SpO2 99%   BMI 34.56 kg/m  Wt Readings from Last 5 Encounters:  11/26/18 247 lb 12.8 oz (112.4 kg)  09/25/18 243 lb (110.2 kg)  08/12/18 243 lb (110.2 kg)  07/17/18 235 lb 3.2 oz (106.7 kg)  06/11/18 229 lb (103.9 kg)   General: Well Developed, well nourished, and in no acute distress.  Neuro/Psych: Alert and oriented x3, extra-ocular muscles intact, able to move all 4 extremities, sensation grossly intact. Skin: Warm and dry, no rashes noted.  Respiratory: Not using accessory muscles, speaking in full sentences, trachea midline.  Cardiovascular: Pulses palpable, no  extremity edema. Abdomen: Does not appear distended. MSK:  Right foot and ankle normal-appearing with no significant swelling or deformity. Normal foot and ankle motion. Not particularly tender to palpation. Pulses cap refill and sensation are intact distally.    Lab and Radiology Results X-ray images obtained today personally and independently reviewed.  Right foot: No acute fractures severe deformity or degeneration.  No chondrocalcinosis visible.  No significant erosions visible.  Right ankle: No acute fractures or severe deformity.  No significant arthritis.  No chondrocalcinosis visible.  Await formal radiology review.  Lab Results  Component Value Date   LABURIC 8.5 (H) 07/17/2018     Assessment and Plan: 33 y.o. male with right foot and ankle pain occurring intermittently over the last several months thought to be due to gout exacerbations. He has been on allopurinol now for some time.  Plan to treat with transition to scheduled prophylactic colchicine.  Will take colchicine daily for about 3 months while allopurinol is starting.  Additionally will check uric acid as well as other rheumatologic labs.  We will adjust allopurinol or possibly Uloric based on uric acid level.  Recommend using cam walker boot intermittently as needed for pain.    Orders Placed This Encounter  Procedures  . DG Ankle Complete Right    Standing Status:   Future  Number of Occurrences:   1    Standing Expiration Date:   01/26/2020    Order Specific Question:   Reason for Exam (SYMPTOM  OR DIAGNOSIS REQUIRED)    Answer:   eval medial ankle pain. suspect gout    Order Specific Question:   Preferred imaging location?    Answer:   [facility]    Order Specific Question:   Radiology Contrast Protocol - do NOT remove file path    Answer:   \\[email or web address]s.pdf  . DG Foot Complete Right    Standing Status:   Future    Number of Occurrences:   1     Standing Expiration Date:   01/26/2020    Order Specific Question:   Reason for Exam (SYMPTOM  OR DIAGNOSIS REQUIRED)    Answer:   eval 1st MTP pain. Suspect gout    Order Specific Question:   Preferred imaging location?    Answer:   [facility]    Order Specific Question:   Radiology Contrast Protocol - do NOT remove file path    Answer:   \\[email or web address]s.pdf  . ANA  . Uric acid  . Sedimentation rate  . Rheumatoid factor   Meds ordered this encounter  Medications  . colchicine 0.6 MG tablet    Sig: Take 1 tablet (0.6 mg total) by mouth daily. For 3 months to prevent gout flair then take daily as needed for gout pain    Dispense:  30 tablet    Refill:  2    Historical information moved to improve visibility of documentation.  Past Medical History:  Diagnosis Date  . Childhood asthma   . Family history of prostate cancer in father   . Gout    Dr. Tamala Julian started allopurinol 10/2018  . Hyperlipidemia    no meds in past.  . Obesity (BMI 30.0-34.9)    Past Surgical History:  Procedure Laterality Date  . CYST EXCISION  2004   Pilonidal   Social History   Tobacco Use  . Smoking status: Never Smoker  . Smokeless tobacco: Never Used  Substance Use Topics  . Alcohol use: Yes    Alcohol/week: 4.0 - 5.0 standard drinks    Types: 4 - 5 Standard drinks or equivalent per week   family history includes Arthritis in his paternal grandmother; Colon cancer in his paternal grandmother; Hearing loss in his father; Hyperlipidemia in his father and maternal grandmother; Hypertension in his father; Prostate cancer in his father.  Medications: Current Outpatient Medications  Medication Sig Dispense Refill  . allopurinol (ZYLOPRIM) 100 MG tablet Take 300 mg by mouth daily.    Marland Kitchen ibuprofen (ADVIL) 600 MG tablet Take 600 mg by mouth every 6 (six) hours as needed.    . indomethacin (INDOCIN) 50 MG capsule Take 1 capsule by mouth 3 times daily,  as needed. 30 capsule 1  . Vitamin D, Ergocalciferol, (DRISDOL) 1.25 MG (50000 UT) CAPS capsule TAKE 1 CAPSULE (50,000 UNITS TOTAL) BY MOUTH EVERY 7 (SEVEN) DAYS. 12 capsule 0  . colchicine 0.6 MG tablet Take 1 tablet (0.6 mg total) by mouth daily. For 3 months to prevent gout flair then take daily as needed for gout pain 30 tablet 2   No current facility-administered medications for this visit.    No Known Allergies    Discussed warning signs or symptoms. Please see discharge instructions. Patient expresses understanding.  The above documentation has been reviewed and is accurate and complete Lynne Leader

## 2018-11-27 ENCOUNTER — Other Ambulatory Visit: Payer: Managed Care, Other (non HMO)

## 2018-12-09 ENCOUNTER — Other Ambulatory Visit: Payer: Managed Care, Other (non HMO)

## 2018-12-14 ENCOUNTER — Other Ambulatory Visit: Payer: Self-pay | Admitting: Family Medicine

## 2018-12-17 NOTE — Telephone Encounter (Signed)
Left message for patient to call back to confirm if he needs medication.

## 2019-01-06 ENCOUNTER — Other Ambulatory Visit: Payer: Self-pay | Admitting: Family Medicine

## 2019-03-28 ENCOUNTER — Other Ambulatory Visit: Payer: Self-pay | Admitting: Family Medicine

## 2019-09-29 ENCOUNTER — Other Ambulatory Visit: Payer: Self-pay

## 2019-09-30 ENCOUNTER — Encounter: Payer: Self-pay | Admitting: Family Medicine

## 2019-09-30 ENCOUNTER — Ambulatory Visit (INDEPENDENT_AMBULATORY_CARE_PROVIDER_SITE_OTHER): Payer: Managed Care, Other (non HMO) | Admitting: Family Medicine

## 2019-09-30 VITALS — BP 130/84 | HR 74 | Temp 97.9°F | Resp 16 | Ht 71.75 in | Wt 246.4 lb

## 2019-09-30 DIAGNOSIS — Z23 Encounter for immunization: Secondary | ICD-10-CM | POA: Diagnosis not present

## 2019-09-30 DIAGNOSIS — E66811 Obesity, class 1: Secondary | ICD-10-CM

## 2019-09-30 DIAGNOSIS — E7800 Pure hypercholesterolemia, unspecified: Secondary | ICD-10-CM

## 2019-09-30 DIAGNOSIS — Z8042 Family history of malignant neoplasm of prostate: Secondary | ICD-10-CM | POA: Diagnosis not present

## 2019-09-30 DIAGNOSIS — Z0000 Encounter for general adult medical examination without abnormal findings: Secondary | ICD-10-CM | POA: Diagnosis not present

## 2019-09-30 DIAGNOSIS — E669 Obesity, unspecified: Secondary | ICD-10-CM | POA: Diagnosis not present

## 2019-09-30 DIAGNOSIS — M25531 Pain in right wrist: Secondary | ICD-10-CM

## 2019-09-30 DIAGNOSIS — Z125 Encounter for screening for malignant neoplasm of prostate: Secondary | ICD-10-CM

## 2019-09-30 DIAGNOSIS — M109 Gout, unspecified: Secondary | ICD-10-CM

## 2019-09-30 DIAGNOSIS — M25532 Pain in left wrist: Secondary | ICD-10-CM

## 2019-09-30 LAB — URIC ACID: Uric Acid, Serum: 7.8 mg/dL (ref 4.0–7.8)

## 2019-09-30 LAB — CBC WITH DIFFERENTIAL/PLATELET
Basophils Absolute: 0 10*3/uL (ref 0.0–0.1)
Basophils Relative: 0.6 % (ref 0.0–3.0)
Eosinophils Absolute: 0.1 10*3/uL (ref 0.0–0.7)
Eosinophils Relative: 1.6 % (ref 0.0–5.0)
HCT: 45.4 % (ref 39.0–52.0)
Hemoglobin: 15.5 g/dL (ref 13.0–17.0)
Lymphocytes Relative: 37.1 % (ref 12.0–46.0)
Lymphs Abs: 1.7 10*3/uL (ref 0.7–4.0)
MCHC: 34.2 g/dL (ref 30.0–36.0)
MCV: 91.2 fl (ref 78.0–100.0)
Monocytes Absolute: 0.4 10*3/uL (ref 0.1–1.0)
Monocytes Relative: 9.3 % (ref 3.0–12.0)
Neutro Abs: 2.3 10*3/uL (ref 1.4–7.7)
Neutrophils Relative %: 51.4 % (ref 43.0–77.0)
Platelets: 175 10*3/uL (ref 150.0–400.0)
RBC: 4.97 Mil/uL (ref 4.22–5.81)
RDW: 12.1 % (ref 11.5–15.5)
WBC: 4.5 10*3/uL (ref 4.0–10.5)

## 2019-09-30 LAB — LIPID PANEL
Cholesterol: 260 mg/dL — ABNORMAL HIGH (ref 0–200)
HDL: 37.9 mg/dL — ABNORMAL LOW (ref >39.00)
NonHDL: 222.54
Total CHOL/HDL Ratio: 7
Triglycerides: 281 mg/dL — ABNORMAL HIGH (ref 0.0–149.0)
VLDL: 56.2 mg/dL — ABNORMAL HIGH (ref 0.0–40.0)

## 2019-09-30 LAB — COMPREHENSIVE METABOLIC PANEL
ALT: 50 U/L (ref 0–53)
AST: 25 U/L (ref 0–37)
Albumin: 5 g/dL (ref 3.5–5.2)
Alkaline Phosphatase: 68 U/L (ref 39–117)
BUN: 18 mg/dL (ref 6–23)
CO2: 27 mEq/L (ref 19–32)
Calcium: 9.7 mg/dL (ref 8.4–10.5)
Chloride: 101 mEq/L (ref 96–112)
Creatinine, Ser: 0.95 mg/dL (ref 0.40–1.50)
GFR: 90.13 mL/min (ref >60.00)
Glucose, Bld: 101 mg/dL — ABNORMAL HIGH (ref 70–99)
Potassium: 4.2 mEq/L (ref 3.5–5.1)
Sodium: 137 mEq/L (ref 135–145)
Total Bilirubin: 1.1 mg/dL (ref 0.2–1.2)
Total Protein: 7.4 g/dL (ref 6.0–8.3)

## 2019-09-30 LAB — LDL CHOLESTEROL, DIRECT: Direct LDL: 181 mg/dL

## 2019-09-30 LAB — TSH: TSH: 1.07 u[IU]/mL (ref 0.35–4.50)

## 2019-09-30 MED ORDER — ALLOPURINOL 300 MG PO TABS: 300.0000 mg | ORAL_TABLET | Freq: Every day | ORAL | 3 refills | Status: DC

## 2019-09-30 NOTE — Patient Instructions (Signed)

## 2019-09-30 NOTE — Progress Notes (Signed)
Office Note 09/30/2019  CC:  Chief Complaint  Patient presents with  . Annual Exam    pt is fasting    HPI:  Dean Thomas is a 34 y.o.  male who is here for annual health maintenance exam. Working out regularly. Eating fairly well.  Maintaining weight fine.  Has had some mild bilat wrist and fingers aching/tightness/stiffness mostly in mornings---started after getting on allopurinol.  No erythema or swelling of these areas.  No recent signif gout flare.  Says he is taking 300 mg qd (3 of the 100s) since last f/u with SM MD.     Past Medical History:  Diagnosis Date  . Childhood asthma   . Family history of prostate cancer in father   . Gout    Dr. Katrinka Blazing started allopurinol 10/2018  . Hyperlipidemia    no meds in past.  . Obesity (BMI 30.0-34.9)     Past Surgical History:  Procedure Laterality Date  . CYST EXCISION  2004   Pilonidal    Family History  Problem Relation Age of Onset  . Prostate cancer Father   . Hearing loss Father   . Hyperlipidemia Father   . Hypertension Father   . Hyperlipidemia Maternal Grandmother   . Arthritis Paternal Grandmother   . Colon cancer Paternal Grandmother     Social History   Socioeconomic History  . Marital status: Married    Spouse name: Not on file  . Number of children: Not on file  . Years of education: Not on file  . Highest education level: Not on file  Occupational History  . Not on file  Tobacco Use  . Smoking status: Never Smoker  . Smokeless tobacco: Never Used  Vaping Use  . Vaping Use: Never used  Substance and Sexual Activity  . Alcohol use: Yes    Alcohol/week: 4.0 - 5.0 standard drinks    Types: 4 - 5 Standard drinks or equivalent per week  . Drug use: Never  . Sexual activity: Not on file  Other Topics Concern  . Not on file  Social History Narrative   Married, 1 y/o daughter as of 09/2017.   Orig from [place].   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    No tobacco.   Alcohol:   Social Determinants of Health   Financial Resource Strain:   . Difficulty of Paying Living Expenses: Not on file  Food Insecurity:   . Worried About Programme researcher, broadcasting/film/video in the Last Year: Not on file  . Ran Out of Food in the Last Year: Not on file  Transportation Needs:   . Lack of Transportation (Medical): Not on file  . Lack of Transportation (Non-Medical): Not on file  Physical Activity:   . Days of Exercise per Week: Not on file  . Minutes of Exercise per Session: Not on file  Stress:   . Feeling of Stress : Not on file  Social Connections:   . Frequency of Communication with Friends and Family: Not on file  . Frequency of Social Gatherings with Friends and Family: Not on file  . Attends Religious Services: Not on file  . Active Member of Clubs or Organizations: Not on file  . Attends Banker Meetings: Not on file  . Marital Status: Not on file  Intimate Partner Violence:   . Fear of Current or Ex-Partner: Not on file  . Emotionally Abused: Not on file  . Physically  Abused: Not on file  . Sexually Abused: Not on file    Outpatient Medications Prior to Visit  Medication Sig Dispense Refill  . allopurinol (ZYLOPRIM) 100 MG tablet TAKE 2 TABLETS BY MOUTH EVERY DAY (Patient taking differently: in the morning, at noon, and at bedtime. ) 180 tablet 1  . colchicine 0.6 MG tablet Take 1 tablet (0.6 mg total) by mouth daily. For 3 months to prevent gout flair then take daily as needed for gout pain (Patient not taking: Reported on 09/30/2019) 30 tablet 2  . ibuprofen (ADVIL) 600 MG tablet Take 600 mg by mouth every 6 (six) hours as needed. (Patient not taking: Reported on 09/30/2019)    . indomethacin (INDOCIN) 50 MG capsule Take 1 capsule by mouth 3 times daily, as needed. (Patient not taking: Reported on 09/30/2019) 30 capsule 1  . Vitamin D, Ergocalciferol, (DRISDOL) 1.25 MG (50000 UT) CAPS capsule TAKE 1 CAPSULE (50,000 UNITS TOTAL) BY MOUTH  EVERY 7 (SEVEN) DAYS. (Patient not taking: Reported on 09/30/2019) 12 capsule 0   No facility-administered medications prior to visit.    No Known Allergies  ROS Review of Systems  Constitutional: Negative for appetite change, chills, fatigue and fever.  HENT: Negative for congestion, dental problem, ear pain and sore throat.   Eyes: Negative for discharge, redness and visual disturbance.  Respiratory: Negative for cough, chest tightness, shortness of breath and wheezing.   Cardiovascular: Negative for chest pain, palpitations and leg swelling.  Gastrointestinal: Negative for abdominal pain, blood in stool, diarrhea, nausea and vomiting.  Genitourinary: Negative for difficulty urinating, dysuria, flank pain, frequency, hematuria and urgency.  Musculoskeletal: Positive for arthralgias (wrists/fingers as noted in hpi). Negative for back pain, joint swelling, myalgias and neck stiffness.  Skin: Negative for pallor and rash.  Neurological: Negative for dizziness, speech difficulty, weakness and headaches.  Hematological: Negative for adenopathy. Does not bruise/bleed easily.  Psychiatric/Behavioral: Negative for confusion and sleep disturbance. The patient is not nervous/anxious.     PE; Vitals with BMI 09/30/2019 11/26/2018 09/25/2018  Height 5' 11.75" 5\' 11"  5\' 11"   Weight 246 lbs 6 oz 247 lbs 13 oz 243 lbs  BMI 33.67 34.58 33.91  Systolic 153 124  Diastolic 90 76 82  Pulse 79 74 69  Repeat BP manually at end of visit: 130/84  Gen: Alert, well appearing.  Patient is oriented to person, place, time, and situation. AFFECT: pleasant, lucid thought and speech. ENT: Ears: EACs clear, normal epithelium.  TMs with good light reflex and landmarks bilaterally.  Eyes: no injection, icteris, swelling, or exudate.  EOMI, PERRLA. Nose: no drainage or turbinate edema/swelling.  No injection or focal lesion.  Mouth: lips without lesion/swelling.  Oral mucosa pink and moist.  Dentition intact and  without obvious caries or gingival swelling.  Oropharynx without erythema, exudate, or swelling.  Neck: supple/nontender.  No LAD, mass, or TM.  Carotid pulses 2+ bilaterally, without bruits. CV: RRR, no m/r/g.   LUNGS: CTA bilat, nonlabored resps, good aeration in all lung fields. ABD: soft, NT, ND, BS normal.  No hepatospenomegaly or mass.  No bruits. EXT: no clubbing, cyanosis, or edema.  Musculoskeletal: no joint swelling, erythema, warmth, or tenderness.  ROM of all joints intact. Skin - no sores or suspicious lesions or rashes or color changes   Pertinent labs:  No results found for: TSH Lab Results  Component Value Date   WBC 5.9 07/17/2018   HGB 16.1 07/17/2018   HCT 47.1 07/17/2018   MCV 89.4  07/17/2018   PLT 192.0 07/17/2018   Lab Results  Component Value Date   CREATININE 0.95 07/17/2018   BUN 15 07/17/2018   NA 141 07/17/2018   K 4.8 07/17/2018   CL 102 07/17/2018   CO2 27 07/17/2018   Lab Results  Component Value Date   ALT 29 07/17/2018   AST 17 07/17/2018   ALKPHOS 71 07/17/2018   BILITOT 0.9 07/17/2018   Lab Results  Component Value Date   LABURIC 8.5 (H) 07/17/2018  (started allopurinol 09/2018)  ASSESSMENT AND PLAN:   1) Wrist and fingers arthralgias/stiffness (mornings)---since starting allopurinol, so suspected this is a side effect from this med, but as per sports med MD suggestion I'll check Rh factor and CCP.  2) Gout: doing well, w/out any flares---on 300mg  qd allopurinol--RF'd today. CHeck uric acid level today.  3) Elev bp w/out dx htn:  Nervous about entire exam, esp phlebotomy. About 5 min after phlebotomy was done his bp came down to normal.  4) Health maintenance exam: Reviewed age and gender appropriate health maintenance issues (prudent diet, regular exercise, health risks of tobacco and excessive alcohol, use of seatbelts, fire alarms in home, use of sunscreen).  Also reviewed age and gender appropriate health screening as well as  vaccine recommendations. Vaccines: Tdap->UTD 2016, need to update our EMR.   Covid 19->UTd.  Flu->GIVEN TODAY. Labs: fasting HP (obesity, HLD), uric acid (gout), Rh factor and CCP IgA (arthralgias). Prostate ca screening: FH prost ca father->start annual DRE/PSA age 45. Colon ca screening: average risk patient= as per latest guidelines, start screening at 41 yrs of age.   An After Visit Summary was printed and given to the patient.  FOLLOW UP:  Return in about 1 year (around 09/29/2020) for annual CPE (fasting).  Signed:  10/01/2020, MD           09/30/2019

## 2019-10-01 LAB — RHEUMATOID FACTOR: Rheumatoid fact SerPl-aCnc: <14 IU/mL (ref <14)

## 2019-10-02 ENCOUNTER — Encounter: Payer: Self-pay | Admitting: Family Medicine

## 2019-10-02 LAB — CYCLIC CITRUL PEPTIDE ANTIBODY, IGG/IGA: Cyclic Citrullin Peptide Ab: 6 units (ref 0–19)

## 2019-10-03 NOTE — Telephone Encounter (Signed)
Patient recently had labs done and was notified all of his labs were normal. He still has concerns regarding his lipid panel.  Please advise, thanks.

## 2019-10-08 ENCOUNTER — Other Ambulatory Visit: Payer: Self-pay | Admitting: Family Medicine

## 2019-11-20 ENCOUNTER — Telehealth: Payer: Self-pay | Admitting: Family Medicine

## 2019-11-20 ENCOUNTER — Other Ambulatory Visit: Payer: Self-pay

## 2019-11-20 MED ORDER — INDOMETHACIN 50 MG PO CAPS: ORAL_CAPSULE | ORAL | 1 refills | Status: DC

## 2019-11-20 NOTE — Telephone Encounter (Signed)
RF sent. Left detailed message advising

## 2019-11-20 NOTE — Telephone Encounter (Signed)
Pt is out of indomethacin and needs a refill called to [facility] (today if possible) as he is having a gout flare.

## 2020-10-05 ENCOUNTER — Ambulatory Visit (INDEPENDENT_AMBULATORY_CARE_PROVIDER_SITE_OTHER): Payer: Managed Care, Other (non HMO) | Admitting: Family Medicine

## 2020-10-05 ENCOUNTER — Encounter: Payer: Self-pay | Admitting: Family Medicine

## 2020-10-05 ENCOUNTER — Other Ambulatory Visit: Payer: Self-pay

## 2020-10-05 VITALS — BP 142/90 | HR 72 | Temp 97.8°F | Resp 18 | Ht 71.75 in | Wt 249.2 lb

## 2020-10-05 DIAGNOSIS — M545 Low back pain, unspecified: Secondary | ICD-10-CM | POA: Diagnosis not present

## 2020-10-05 DIAGNOSIS — R109 Unspecified abdominal pain: Secondary | ICD-10-CM

## 2020-10-05 DIAGNOSIS — Z87442 Personal history of urinary calculi: Secondary | ICD-10-CM | POA: Insufficient documentation

## 2020-10-05 DIAGNOSIS — R1032 Left lower quadrant pain: Secondary | ICD-10-CM

## 2020-10-05 LAB — COMPREHENSIVE METABOLIC PANEL
ALT: 43 U/L (ref 0–53)
AST: 25 U/L (ref 0–37)
Albumin: 5 g/dL (ref 3.5–5.2)
Alkaline Phosphatase: 70 U/L (ref 39–117)
BUN: 12 mg/dL (ref 6–23)
CO2: 26 mEq/L (ref 19–32)
Calcium: 9.8 mg/dL (ref 8.4–10.5)
Chloride: 103 mEq/L (ref 96–112)
Creatinine, Ser: 0.86 mg/dL (ref 0.40–1.50)
GFR: 111.65 mL/min (ref >60.00)
Glucose, Bld: 98 mg/dL (ref 70–99)
Potassium: 4.8 mEq/L (ref 3.5–5.1)
Sodium: 139 mEq/L (ref 135–145)
Total Bilirubin: 1 mg/dL (ref 0.2–1.2)
Total Protein: 7.4 g/dL (ref 6.0–8.3)

## 2020-10-05 LAB — POC URINALSYSI DIPSTICK (AUTOMATED)
Bilirubin, UA: NEGATIVE
Blood, UA: NEGATIVE
Glucose, UA: NEGATIVE
Ketones, UA: NEGATIVE
Leukocytes, UA: NEGATIVE
Nitrite, UA: NEGATIVE
Protein, UA: NEGATIVE
Spec Grav, UA: <=1.005 — AB (ref 1.010–1.025)
Urobilinogen, UA: 0.2 E.U./dL
pH, UA: 7.5 (ref 5.0–8.0)

## 2020-10-05 LAB — CBC WITH DIFFERENTIAL/PLATELET
Basophils Absolute: 0 10*3/uL (ref 0.0–0.1)
Basophils Relative: 0.5 % (ref 0.0–3.0)
Eosinophils Absolute: 0 10*3/uL (ref 0.0–0.7)
Eosinophils Relative: 0.8 % (ref 0.0–5.0)
HCT: 46.4 % (ref 39.0–52.0)
Hemoglobin: 15.6 g/dL (ref 13.0–17.0)
Lymphocytes Relative: 32.2 % (ref 12.0–46.0)
Lymphs Abs: 1.6 10*3/uL (ref 0.7–4.0)
MCHC: 33.6 g/dL (ref 30.0–36.0)
MCV: 91.1 fl (ref 78.0–100.0)
Monocytes Absolute: 0.4 10*3/uL (ref 0.1–1.0)
Monocytes Relative: 7.7 % (ref 3.0–12.0)
Neutro Abs: 2.9 10*3/uL (ref 1.4–7.7)
Neutrophils Relative %: 58.8 % (ref 43.0–77.0)
Platelets: 188 10*3/uL (ref 150.0–400.0)
RBC: 5.09 Mil/uL (ref 4.22–5.81)
RDW: 12.2 % (ref 11.5–15.5)
WBC: 4.9 10*3/uL (ref 4.0–10.5)

## 2020-10-05 NOTE — Progress Notes (Signed)
Established Patient Office Visit  Subjective:  Patient ID: Dean Thomas, male    DOB: 02-Nov-1985  Age: 35 y.o. MRN: [id number]  CC:  Chief Complaint  Patient presents with   Back Pain    Pt states pain started on Sunday, Pt states having low left back pain. Pt states pain has turned into a dull ache. Pt states no blood in urine.     HPI Dean Thomas presents for L flank pain that radiates to the front and comes and goes.  He has a hx of kidney stones and states it feels the same way.  No fevers , no blood in the urine   the pain started on Sunday   Past Medical History:  Diagnosis Date   Childhood asthma    Family history of prostate cancer in father    Gout    Dr. Katrinka Blazing started allopurinol 10/2018--doing great since then.   Hyperlipidemia    no meds in past.   Obesity (BMI 30.0-34.9)     Past Surgical History:  Procedure Laterality Date   CYST EXCISION  2004   Pilonidal    Family History  Problem Relation Age of Onset   Prostate cancer Father    Hearing loss Father    Hyperlipidemia Father    Hypertension Father    Hyperlipidemia Maternal Grandmother    Arthritis Paternal Grandmother    Colon cancer Paternal Grandmother     Social History   Socioeconomic History   Marital status: Married    Spouse name: Not on file   Number of children: Not on file   Years of education: Not on file   Highest education level: Not on file  Occupational History   Not on file  Tobacco Use   Smoking status: Never   Smokeless tobacco: Never  Vaping Use   Vaping Use: Never used  Substance and Sexual Activity   Alcohol use: Yes    Alcohol/week: 4.0 - 5.0 standard drinks    Types: 4 - 5 Standard drinks or equivalent per week   Drug use: Never   Sexual activity: Not on file  Other Topics Concern   Not on file  Social History Narrative   Married, 1 y/o daughter as of 09/2017.   Orig from [place].   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   No tobacco.   Alcohol:   Social Determinants of Corporate investment banker Strain: Not on file  Food Insecurity: Not on file  Transportation Needs: Not on file  Physical Activity: Not on file  Stress: Not on file  Social Connections: Not on file  Intimate Partner Violence: Not on file    Outpatient Medications Prior to Visit  Medication Sig Dispense Refill   allopurinol (ZYLOPRIM) 300 MG tablet Take 1 tablet (300 mg total) by mouth daily. 90 tablet 3   indomethacin (INDOCIN) 50 MG capsule Take 1 capsule by mouth 3 times daily, as needed. 30 capsule 1   colchicine 0.6 MG tablet Take 1 tablet (0.6 mg total) by mouth daily. For 3 months to prevent gout flair then take daily as needed for gout pain (Patient not taking: No sig reported) 30 tablet 2   ibuprofen (ADVIL) 600 MG tablet Take 600 mg by mouth every 6 (six) hours as needed. (Patient not taking: No sig reported)     No facility-administered medications prior to visit.    No Known Allergies  ROS Review of  Systems  Constitutional:  Negative for appetite change, diaphoresis, fatigue and unexpected weight change.  Eyes:  Negative for pain, redness and visual disturbance.  Respiratory:  Negative for cough, chest tightness, shortness of breath and wheezing.   Cardiovascular:  Negative for chest pain, palpitations and leg swelling.  Gastrointestinal:  Positive for abdominal pain.  Endocrine: Negative for cold intolerance, heat intolerance, polydipsia, polyphagia and polyuria.  Genitourinary:  Positive for flank pain. Negative for difficulty urinating, dysuria and frequency.  Neurological:  Negative for dizziness, light-headedness, numbness and headaches.     Objective:    Physical Exam Vitals and nursing note reviewed.  Constitutional:      Appearance: He is well-developed.  HENT:     Head: Normocephalic and atraumatic.  Eyes:     Pupils: Pupils are equal, round, and reactive to light.  Neck:     Thyroid: No  thyromegaly.  Cardiovascular:     Rate and Rhythm: Normal rate and regular rhythm.     Heart sounds: No murmur heard. Pulmonary:     Effort: Pulmonary effort is normal. No respiratory distress.     Breath sounds: Normal breath sounds. No wheezing or rales.  Chest:     Chest wall: No tenderness.  Abdominal:     Tenderness: There is abdominal tenderness in the left lower quadrant. There is left CVA tenderness. There is no right CVA tenderness, guarding or rebound.  Musculoskeletal:        General: No tenderness.     Cervical back: Normal range of motion and neck Thomas.  Skin:    General: Skin is warm and dry.  Neurological:     Mental Status: He is alert and oriented to person, place, and time.  Psychiatric:        Behavior: Behavior normal.        Thought Content: Thought content normal.        Judgment: Judgment normal.    BP (!) 142/90 (BP Location: Left Arm, Patient Position: Sitting, Cuff Size: Large)   Pulse 72   Temp 97.8 F (36.6 C) (Oral)   Resp 18   Ht 5' 11.75" (1.822 m)   Wt 249 lb 3.2 oz (113 kg)   SpO2 99%   BMI 34.03 kg/m  Wt Readings from Last 3 Encounters:  10/05/20 249 lb 3.2 oz (113 kg)  09/30/19 246 lb 6.4 oz (111.8 kg)  11/26/18 247 lb 12.8 oz (112.4 kg)     Health Maintenance Due  Topic Date Due   HIV Screening  Never done   Hepatitis C Screening  Never done   INFLUENZA VACCINE  08/09/2020    There are no preventive care reminders to display for this patient.  Lab Results  Component Value Date   TSH 1.07 09/30/2019   Lab Results  Component Value Date   WBC 4.5 09/30/2019   HGB 15.5 09/30/2019   HCT 45.4 09/30/2019   MCV 91.2 09/30/2019   PLT 175.0 09/30/2019   Lab Results  Component Value Date   NA 137 09/30/2019   K 4.2 09/30/2019   CO2 27 09/30/2019   GLUCOSE 101 (H) 09/30/2019   BUN 18 09/30/2019   CREATININE 0.95 09/30/2019   BILITOT 1.1 09/30/2019   ALKPHOS 68 09/30/2019   AST 25 09/30/2019   ALT 50 09/30/2019   PROT  7.4 09/30/2019   ALBUMIN 5.0 09/30/2019   CALCIUM 9.7 09/30/2019   GFR 90.13 09/30/2019   Lab Results  Component Value Date   CHOL  260 (H) 09/30/2019   Lab Results  Component Value Date   HDL 37.90 (L) 09/30/2019   No results found for: [facility] Lab Results  Component Value Date   TRIG 281.0 (H) 09/30/2019   Lab Results  Component Value Date   CHOLHDL 7 09/30/2019   No results found for: HGBA1C    Assessment & Plan:   Problem List Items Addressed This Visit       Unprioritized   Flank pain - Primary    History kidney stones Pt states the pain feels the same way Ct ordered  Check labs and ua       Relevant Orders   CBC with Differential/Platelet   Comprehensive metabolic panel   History of kidney stones   Other Visit Diagnoses     Acute low back pain, unspecified back pain laterality, unspecified whether sciatica present       Relevant Orders   POCT Urinalysis Dipstick (Automated) (Completed)   Left lower quadrant abdominal pain       Relevant Orders   CT RENAL STONE STUDY       No orders of the defined types were placed in this encounter.   Follow-up: No follow-ups on file.    Donato Schultz, DO

## 2020-10-05 NOTE — Patient Instructions (Signed)
Renal Colic  Renal colic is pain that is caused by passing a kidney stone. The pain can be sharp and severe. It may be felt in the back, abdomen, side (flank), or groin. It can cause nausea. Renal colic can come and go. Follow these instructions at home: Watch your condition for any changes. The following actions may help to lessenany discomfort that you are feeling: Medicines Take over-the-counter and prescription medicines only as told by your health care provider. Do not drive or use heavy machinery while taking prescription pain medicine. Eating and drinking  Drink enough fluid to keep your urine pale yellow. You may be instructed to drink at least 8-10 glasses of water each day. Follow instructions from your health care provider. If directed, change your diet. This may include: Limiting how much sodium you eat. You may need to eat less than 2 grams (2,000 mg) per day. Eating more fruits and vegetables. Limiting how much animal protein, such as red meat, poultry, fish, and eggs, you eat. Avoiding foods such as spinach, rhubarb, sweet potatoes, and nuts. These make kidney stones more likely to form. Follow instructions from your health care provider about eating or drinking restrictions.  General instructions Keep all follow-up visits as told by your health care provider. This is important. Collect urine samples as told by your health care provider. You may need to collect a urine sample: 24 hours after you pass the stone. 8-12 weeks after passing the kidney stone, and every 6-12 months after that. Strain your urine every time you urinate, for as long as directed. Use the strainer that your health care provider recommends. Do not throw out the kidney stone after passing it. Keep the stone so it can be tested by your health care provider. Testing the makeup of your kidney stone may help understand how to prevent you from getting kidney stones in the future. Contact a health care provider  if: You have a fever or chills. Your urine smells bad or looks cloudy. You have pain or burning when you pass urine. Get help right away if: Your flank pain or groin pain suddenly worsens. You become confused or disoriented or you lose consciousness. Summary Renal colic is pain that is caused by passing a kidney stone. Take over-the-counter and prescription medicines only as told by your health care provider. Drink enough fluid to keep your urine pale yellow. You may be instructed to drink at least 8-10 glasses of water each day. Follow instructions from your health care provider. Strain your urine every time you urinate, for as long as directed. Use the strainer that your health care provider recommends. Do not throw out the kidney stone after passing it. Keep the stone so it can be tested by your health care provider. This information is not intended to replace advice given to you by your health care provider. Make sure you discuss any questions you have with your healthcare provider. Document Revised: 01/23/2017 Document Reviewed: 01/23/2017 Elsevier Patient Education  2022 Elsevier Inc.  

## 2020-10-05 NOTE — Assessment & Plan Note (Signed)
History kidney stones Pt states the pain feels the same way Ct ordered  Check labs and ua

## 2020-10-06 ENCOUNTER — Ambulatory Visit (INDEPENDENT_AMBULATORY_CARE_PROVIDER_SITE_OTHER): Payer: Managed Care, Other (non HMO)

## 2020-10-06 DIAGNOSIS — R1032 Left lower quadrant pain: Secondary | ICD-10-CM

## 2020-10-06 DIAGNOSIS — R1031 Right lower quadrant pain: Secondary | ICD-10-CM | POA: Diagnosis not present

## 2020-10-06 NOTE — Progress Notes (Signed)
No kidney stone----- if pain con't f/u with pcp There is a small hernia in the groin but it does not say which side

## 2020-11-02 ENCOUNTER — Other Ambulatory Visit: Payer: Self-pay | Admitting: Family Medicine

## 2020-11-16 ENCOUNTER — Other Ambulatory Visit: Payer: Self-pay | Admitting: Family Medicine

## 2021-07-20 ENCOUNTER — Other Ambulatory Visit: Payer: Self-pay | Admitting: Family Medicine

## 2021-07-22 MED ORDER — ALLOPURINOL 300 MG PO TABS: 300.0000 mg | ORAL_TABLET | Freq: Every day | ORAL | 0 refills | Status: DC

## 2021-08-13 ENCOUNTER — Other Ambulatory Visit: Payer: Self-pay | Admitting: Family Medicine

## 2021-08-30 ENCOUNTER — Other Ambulatory Visit: Payer: Self-pay | Admitting: Family Medicine

## 2021-09-24 ENCOUNTER — Other Ambulatory Visit: Payer: Self-pay | Admitting: Family Medicine

## 2022-03-03 NOTE — Patient Instructions (Incomplete)
Health Maintenance, Male Adopting a healthy lifestyle and getting preventive care are important in promoting health and wellness. Ask your health care provider about: The right schedule for you to have regular tests and exams. Things you can do on your own to prevent diseases and keep yourself healthy. What should I know about diet, weight, and exercise? Eat a healthy diet  Eat a diet that includes plenty of vegetables, fruits, low-fat dairy products, and lean protein. Do not eat a lot of foods that are high in solid fats, added sugars, or sodium. Maintain a healthy weight Body mass index (BMI) is a measurement that can be used to identify possible weight problems. It estimates body fat based on height and weight. Your health care provider can help determine your BMI and help you achieve or maintain a healthy weight. Get regular exercise Get regular exercise. This is one of the most important things you can do for your health. Most adults should: Exercise for at least 150 minutes each week. The exercise should increase your heart rate and make you sweat (moderate-intensity exercise). Do strengthening exercises at least twice a week. This is in addition to the moderate-intensity exercise. Spend less time sitting. Even light physical activity can be beneficial. Watch cholesterol and blood lipids Have your blood tested for lipids and cholesterol at 38 years of age, then have this test every 5 years. You may need to have your cholesterol levels checked more often if: Your lipid or cholesterol levels are high. You are older than 38 years of age. You are at high risk for heart disease. What should I know about cancer screening? Many types of cancers can be detected early and may often be prevented. Depending on your health history and family history, you may need to have cancer screening at various ages. This may include screening for: Colorectal cancer. Prostate cancer. Skin cancer. Lung  cancer. What should I know about heart disease, diabetes, and high blood pressure? Blood pressure and heart disease High blood pressure causes heart disease and increases the risk of stroke. This is more likely to develop in people who have high blood pressure readings or are overweight. Talk with your health care provider about your target blood pressure readings. Have your blood pressure checked: Every 3-5 years if you are 18-39 years of age. Every year if you are 40 years old or older. If you are between the ages of 65 and 75 and are a current or former smoker, ask your health care provider if you should have a one-time screening for abdominal aortic aneurysm (AAA). Diabetes Have regular diabetes screenings. This checks your fasting blood sugar level. Have the screening done: Once every three years after age 45 if you are at a normal weight and have a low risk for diabetes. More often and at a younger age if you are overweight or have a high risk for diabetes. What should I know about preventing infection? Hepatitis B If you have a higher risk for hepatitis B, you should be screened for this virus. Talk with your health care provider to find out if you are at risk for hepatitis B infection. Hepatitis C Blood testing is recommended for: Everyone born from 1945 through 1965. Anyone with known risk factors for hepatitis C. Sexually transmitted infections (STIs) You should be screened each year for STIs, including gonorrhea and chlamydia, if: You are sexually active and are younger than 38 years of age. You are older than 38 years of age and your   health care provider tells you that you are at risk for this type of infection. Your sexual activity has changed since you were last screened, and you are at increased risk for chlamydia or gonorrhea. Ask your health care provider if you are at risk. Ask your health care provider about whether you are at high risk for HIV. Your health care provider  may recommend a prescription medicine to help prevent HIV infection. If you choose to take medicine to prevent HIV, you should first get tested for HIV. You should then be tested every 3 months for as long as you are taking the medicine. Follow these instructions at home: Alcohol use Do not drink alcohol if your health care provider tells you not to drink. If you drink alcohol: Limit how much you have to 0-2 drinks a day. Know how much alcohol is in your drink. In the [place] one drink equals one 12 oz bottle of beer (355 mL), one 5 oz glass of wine (148 mL), or one 1 oz glass of hard liquor (44 mL). Lifestyle Do not use any products that contain nicotine or tobacco. These products include cigarettes, chewing tobacco, and vaping devices, such as e-cigarettes. If you need help quitting, ask your health care provider. Do not use street drugs. Do not share needles. Ask your health care provider for help if you need support or information about quitting drugs. General instructions Schedule regular health, dental, and eye exams. Stay current with your vaccines. Tell your health care provider if: You often feel depressed. You have ever been abused or do not feel safe at home. Summary Adopting a healthy lifestyle and getting preventive care are important in promoting health and wellness. Follow your health care provider's instructions about healthy diet, exercising, and getting tested or screened for diseases. Follow your health care provider's instructions on monitoring your cholesterol and blood pressure. This information is not intended to replace advice given to you by your health care provider. Make sure you discuss any questions you have with your health care provider. Document Revised: 05/17/2020 Document Reviewed: 05/17/2020 Elsevier Patient Education  2023 Elsevier Inc.  

## 2022-03-06 ENCOUNTER — Telehealth: Payer: Self-pay

## 2022-03-06 ENCOUNTER — Encounter: Payer: Managed Care, Other (non HMO) | Admitting: Family Medicine

## 2022-03-06 NOTE — Progress Notes (Deleted)
Office Note 03/06/2022  CC: No chief complaint on file.   HPI:  Patient is a 37 y.o. male who is here for annual health maintenance exam and ***.  Past Medical History:  Diagnosis Date   Childhood asthma    Family history of prostate cancer in father    Gout    Dr. Tamala Julian started allopurinol 10/2018--doing great since then.   Hyperlipidemia    no meds in past.   Obesity (BMI 30.0-34.9)     Past Surgical History:  Procedure Laterality Date   CYST EXCISION  2004   Pilonidal    Family History  Problem Relation Age of Onset   Prostate cancer Father    Hearing loss Father    Hyperlipidemia Father    Hypertension Father    Hyperlipidemia Maternal Grandmother    Arthritis Paternal Grandmother    Colon cancer Paternal Grandmother     Social History   Socioeconomic History   Marital status: Married    Spouse name: Not on file   Number of children: Not on file   Years of education: Not on file   Highest education level: Not on file  Occupational History   Not on file  Tobacco Use   Smoking status: Never   Smokeless tobacco: Never  Vaping Use   Vaping Use: Never used  Substance and Sexual Activity   Alcohol use: Yes    Alcohol/week: 4.0 - 5.0 standard drinks of alcohol    Types: 4 - 5 Standard drinks or equivalent per week   Drug use: Never   Sexual activity: Not on file  Other Topics Concern   Not on file  Social History Narrative   Married, 43 y/o daughter as of 09/2017.   Orig from [place].   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   No tobacco.   Alcohol:   Social Determinants of Radio broadcast assistant Strain: Not on file  Food Insecurity: Not on file  Transportation Needs: Not on file  Physical Activity: Not on file  Stress: Not on file  Social Connections: Not on file  Intimate Partner Violence: Not on file    Outpatient Medications Prior to Visit  Medication Sig Dispense Refill   allopurinol (ZYLOPRIM)  300 MG tablet TAKE 1 TABLET BY MOUTH EVERY DAY 30 tablet 0   colchicine 0.6 MG tablet Take 1 tablet (0.6 mg total) by mouth daily. For 3 months to prevent gout flair then take daily as needed for gout pain (Patient not taking: No sig reported) 30 tablet 2   ibuprofen (ADVIL) 600 MG tablet Take 600 mg by mouth every 6 (six) hours as needed. (Patient not taking: No sig reported)     indomethacin (INDOCIN) 50 MG capsule Take 1 capsule by mouth 3 times daily, as needed. 30 capsule 1   No facility-administered medications prior to visit.    No Known Allergies  Review of Systems *** PE;    10/05/2020   10:06 AM 09/30/2019    8:01 AM 11/26/2018    8:01 AM  Vitals with BMI  Height 5' 11.75" 5' 11.75" '5\' 11"'$   Weight 249 lbs 3 oz 246 lbs 6 oz 247 lbs 13 oz  BMI 34.05 [id number]  Systolic [id number]  Diastolic 90 84 76  Pulse 72 74 74     *** Pertinent labs:  Lab Results  Component Value Date   TSH 1.07 09/30/2019   Lab Results  Component Value Date   WBC 4.9 10/05/2020   HGB 15.6 10/05/2020   HCT 46.4 10/05/2020   MCV 91.1 10/05/2020   PLT 188.0 10/05/2020   Lab Results  Component Value Date   CREATININE 0.86 10/05/2020   BUN 12 10/05/2020   NA 139 10/05/2020   K 4.8 10/05/2020   CL 103 10/05/2020   CO2 26 10/05/2020   Lab Results  Component Value Date   ALT 43 10/05/2020   AST 25 10/05/2020   ALKPHOS 70 10/05/2020   BILITOT 1.0 10/05/2020   Lab Results  Component Value Date   CHOL 260 (H) 09/30/2019   Lab Results  Component Value Date   HDL 37.90 (L) 09/30/2019   No results found for: "LDLCALC" Lab Results  Component Value Date   TRIG 281.0 (H) 09/30/2019   Lab Results  Component Value Date   CHOLHDL 7 09/30/2019   ASSESSMENT AND PLAN:   No problem-specific Assessment & Plan notes found for this encounter.  Health maintenance exam: Reviewed age and gender appropriate health maintenance issues (prudent diet, regular exercise, health risks of  tobacco and excessive alcohol, use of seatbelts, fire alarms in home, use of sunscreen).  Also reviewed age and gender appropriate health screening as well as vaccine recommendations. Vaccines: Tdap UTD. Labs: Fasting health panel Prostate ca screening: FH prost ca father->start annual DRE/PSA age 27.  Colon ca screening: average risk patient= as per latest guidelines, start screening at 40 yrs of age.   An After Visit Summary was printed and given to the patient.  FOLLOW UP:  No follow-ups on file.  Signed:  Crissie Sickles, MD           03/06/2022

## 2022-03-06 NOTE — Telephone Encounter (Signed)
LVM for pt to return call. Please assist patient with rescheduling, thanks.  [facility] - Client Nonclinical Telephone Record  [facility] - Client Client Site [facility] [place] [place] - Day Provider Crissie Sickles - MD Contact Type Call Who Is Calling Patient / Member / Family / Caregiver Caller Name [place] Phone Number [phone] Patient Name Dean Thomas Patient DOB 10-Aug-1985 Call Type Message Only Information Provided Reason for Call Request to Reschedule Office Appointment Initial Comment Caller states he has an appt for tomorrow. He is wanting to reschedule. Disp. Time Disposition Final User 03/05/2022 2:46:29 PM General Information Provided Yes Sinclair Grooms Call Closed By: Sinclair Grooms Transaction Date/Time: 03/05/2022 2:44:39 PM (ET)

## 2022-03-16 NOTE — Patient Instructions (Signed)
Health Maintenance, Male Adopting a healthy lifestyle and getting preventive care are important in promoting health and wellness. Ask your health care provider about: The right schedule for you to have regular tests and exams. Things you can do on your own to prevent diseases and keep yourself healthy. What should I know about diet, weight, and exercise? Eat a healthy diet  Eat a diet that includes plenty of vegetables, fruits, low-fat dairy products, and lean protein. Do not eat a lot of foods that are high in solid fats, added sugars, or sodium. Maintain a healthy weight Body mass index (BMI) is a measurement that can be used to identify possible weight problems. It estimates body fat based on height and weight. Your health care provider can help determine your BMI and help you achieve or maintain a healthy weight. Get regular exercise Get regular exercise. This is one of the most important things you can do for your health. Most adults should: Exercise for at least 150 minutes each week. The exercise should increase your heart rate and make you sweat (moderate-intensity exercise). Do strengthening exercises at least twice a week. This is in addition to the moderate-intensity exercise. Spend less time sitting. Even light physical activity can be beneficial. Watch cholesterol and blood lipids Have your blood tested for lipids and cholesterol at 38 years of age, then have this test every 5 years. You may need to have your cholesterol levels checked more often if: Your lipid or cholesterol levels are high. You are older than 38 years of age. You are at high risk for heart disease. What should I know about cancer screening? Many types of cancers can be detected early and may often be prevented. Depending on your health history and family history, you may need to have cancer screening at various ages. This may include screening for: Colorectal cancer. Prostate cancer. Skin cancer. Lung  cancer. What should I know about heart disease, diabetes, and high blood pressure? Blood pressure and heart disease High blood pressure causes heart disease and increases the risk of stroke. This is more likely to develop in people who have high blood pressure readings or are overweight. Talk with your health care provider about your target blood pressure readings. Have your blood pressure checked: Every 3-5 years if you are 18-39 years of age. Every year if you are 40 years old or older. If you are between the ages of 65 and 75 and are a current or former smoker, ask your health care provider if you should have a one-time screening for abdominal aortic aneurysm (AAA). Diabetes Have regular diabetes screenings. This checks your fasting blood sugar level. Have the screening done: Once every three years after age 45 if you are at a normal weight and have a low risk for diabetes. More often and at a younger age if you are overweight or have a high risk for diabetes. What should I know about preventing infection? Hepatitis B If you have a higher risk for hepatitis B, you should be screened for this virus. Talk with your health care provider to find out if you are at risk for hepatitis B infection. Hepatitis C Blood testing is recommended for: Everyone born from 1945 through 1965. Anyone with known risk factors for hepatitis C. Sexually transmitted infections (STIs) You should be screened each year for STIs, including gonorrhea and chlamydia, if: You are sexually active and are younger than 38 years of age. You are older than 38 years of age and your   health care provider tells you that you are at risk for this type of infection. Your sexual activity has changed since you were last screened, and you are at increased risk for chlamydia or gonorrhea. Ask your health care provider if you are at risk. Ask your health care provider about whether you are at high risk for HIV. Your health care provider  may recommend a prescription medicine to help prevent HIV infection. If you choose to take medicine to prevent HIV, you should first get tested for HIV. You should then be tested every 3 months for as long as you are taking the medicine. Follow these instructions at home: Alcohol use Do not drink alcohol if your health care provider tells you not to drink. If you drink alcohol: Limit how much you have to 0-2 drinks a day. Know how much alcohol is in your drink. In the [place] one drink equals one 12 oz bottle of beer (355 mL), one 5 oz glass of wine (148 mL), or one 1 oz glass of hard liquor (44 mL). Lifestyle Do not use any products that contain nicotine or tobacco. These products include cigarettes, chewing tobacco, and vaping devices, such as e-cigarettes. If you need help quitting, ask your health care provider. Do not use street drugs. Do not share needles. Ask your health care provider for help if you need support or information about quitting drugs. General instructions Schedule regular health, dental, and eye exams. Stay current with your vaccines. Tell your health care provider if: You often feel depressed. You have ever been abused or do not feel safe at home. Summary Adopting a healthy lifestyle and getting preventive care are important in promoting health and wellness. Follow your health care provider's instructions about healthy diet, exercising, and getting tested or screened for diseases. Follow your health care provider's instructions on monitoring your cholesterol and blood pressure. This information is not intended to replace advice given to you by your health care provider. Make sure you discuss any questions you have with your health care provider. Document Revised: 05/17/2020 Document Reviewed: 05/17/2020 Elsevier Patient Education  2023 Elsevier Inc.  

## 2022-03-20 ENCOUNTER — Ambulatory Visit (INDEPENDENT_AMBULATORY_CARE_PROVIDER_SITE_OTHER): Payer: 59 | Admitting: Family Medicine

## 2022-03-20 DIAGNOSIS — M109 Gout, unspecified: Secondary | ICD-10-CM

## 2022-03-20 DIAGNOSIS — Z0000 Encounter for general adult medical examination without abnormal findings: Secondary | ICD-10-CM

## 2022-03-20 DIAGNOSIS — E559 Vitamin D deficiency, unspecified: Secondary | ICD-10-CM

## 2022-03-20 NOTE — Progress Notes (Signed)
Office Note 03/20/2022  CC: No chief complaint on file.   HPI:  Patient is a 37 y.o. male who is here for annual health maintenance exam.  Past Medical History:  Diagnosis Date   Childhood asthma    Family history of prostate cancer in father    Gout    Dr. Tamala Julian started allopurinol 10/2018--doing great since then.   Hyperlipidemia    no meds in past.   Obesity (BMI 30.0-34.9)     Past Surgical History:  Procedure Laterality Date   CYST EXCISION  2004   Pilonidal    Family History  Problem Relation Age of Onset   Prostate cancer Father    Hearing loss Father    Hyperlipidemia Father    Hypertension Father    Hyperlipidemia Maternal Grandmother    Arthritis Paternal Grandmother    Colon cancer Paternal Grandmother     Social History   Socioeconomic History   Marital status: Married    Spouse name: Not on file   Number of children: Not on file   Years of education: Not on file   Highest education level: Not on file  Occupational History   Not on file  Tobacco Use   Smoking status: Never   Smokeless tobacco: Never  Vaping Use   Vaping Use: Never used  Substance and Sexual Activity   Alcohol use: Yes    Alcohol/week: 4.0 - 5.0 standard drinks of alcohol    Types: 4 - 5 Standard drinks or equivalent per week   Drug use: Never   Sexual activity: Not on file  Other Topics Concern   Not on file  Social History Narrative   Married, 18 y/o daughter as of 09/2017.   Orig from [place].   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   No tobacco.   Alcohol:   Social Determinants of Radio broadcast assistant Strain: Not on file  Food Insecurity: Not on file  Transportation Needs: Not on file  Physical Activity: Not on file  Stress: Not on file  Social Connections: Not on file  Intimate Partner Violence: Not on file    Outpatient Medications Prior to Visit  Medication Sig Dispense Refill   allopurinol (ZYLOPRIM) 300 MG  tablet TAKE 1 TABLET BY MOUTH EVERY DAY 30 tablet 0   colchicine 0.6 MG tablet Take 1 tablet (0.6 mg total) by mouth daily. For 3 months to prevent gout flair then take daily as needed for gout pain (Patient not taking: No sig reported) 30 tablet 2   ibuprofen (ADVIL) 600 MG tablet Take 600 mg by mouth every 6 (six) hours as needed. (Patient not taking: No sig reported)     indomethacin (INDOCIN) 50 MG capsule Take 1 capsule by mouth 3 times daily, as needed. 30 capsule 1   No facility-administered medications prior to visit.    No Known Allergies  Review of Systems *** PE;    10/05/2020   10:06 AM 09/30/2019    8:01 AM 11/26/2018    8:01 AM  Vitals with BMI  Height 5' 11.75" 5' 11.75" '5\' 11"'$   Weight 249 lbs 3 oz 246 lbs 6 oz 247 lbs 13 oz  BMI 34.05 [id number]  Systolic [id number]  Diastolic 90 84 76  Pulse 72 74 74     *** Pertinent labs:  *** ASSESSMENT AND PLAN:   No problem-specific Assessment & Plan notes found for this encounter.  Health maintenance exam: Reviewed age and gender appropriate health maintenance issues (prudent diet, regular exercise, health risks of tobacco and excessive alcohol, use of seatbelts, fire alarms in home, use of sunscreen).  Also reviewed age and gender appropriate health screening as well as vaccine recommendations. Vaccines: UTD Labs: fasting HP (obesity, HLD), uric acid (gout), vit D (hx vit D def). Prostate ca screening: FH prost ca father->start annual DRE/PSA age 79. Colon ca screening: average risk patient= as per latest guidelines, start screening at 36 yrs of age.  An After Visit Summary was printed and given to the patient.  FOLLOW UP:  No follow-ups on file.  Signed:  Crissie Sickles, MD           03/20/2022

## 2022-04-03 ENCOUNTER — Encounter: Payer: Self-pay | Admitting: Family Medicine

## 2022-05-03 ENCOUNTER — Other Ambulatory Visit: Payer: Self-pay | Admitting: Family Medicine

## 2022-05-08 ENCOUNTER — Other Ambulatory Visit: Payer: Self-pay | Admitting: Family Medicine

## 2022-05-08 MED ORDER — ALLOPURINOL 300 MG PO TABS: 300.0000 mg | ORAL_TABLET | Freq: Every day | ORAL | 0 refills | Status: DC

## 2022-05-08 NOTE — Telephone Encounter (Signed)
Called and left VM for pt to return my call if he has questions why his meds were denied.

## 2022-05-08 NOTE — Telephone Encounter (Signed)
Patient refill request.  Patient scheduled appt with Dr. Milinda Cave on 05/23/22.  [facility]  allopurinol (ZYLOPRIM) 300 MG tablet

## 2022-05-23 ENCOUNTER — Ambulatory Visit (INDEPENDENT_AMBULATORY_CARE_PROVIDER_SITE_OTHER): Payer: 59 | Admitting: Family Medicine

## 2022-05-23 ENCOUNTER — Encounter: Payer: Self-pay | Admitting: Family Medicine

## 2022-05-23 VITALS — BP 159/92 | HR 74 | Temp 97.7°F | Ht 71.75 in | Wt 255.4 lb

## 2022-05-23 DIAGNOSIS — E782 Mixed hyperlipidemia: Secondary | ICD-10-CM | POA: Diagnosis not present

## 2022-05-23 DIAGNOSIS — Z0000 Encounter for general adult medical examination without abnormal findings: Secondary | ICD-10-CM

## 2022-05-23 DIAGNOSIS — R030 Elevated blood-pressure reading, without diagnosis of hypertension: Secondary | ICD-10-CM

## 2022-05-23 DIAGNOSIS — M1000 Idiopathic gout, unspecified site: Secondary | ICD-10-CM

## 2022-05-23 MED ORDER — ALLOPURINOL 300 MG PO TABS: 300.0000 mg | ORAL_TABLET | Freq: Every day | ORAL | 3 refills | Status: DC

## 2022-05-23 NOTE — Progress Notes (Signed)
OFFICE VISIT  05/23/2022  CC:  Chief Complaint  Patient presents with   Medical Management of Chronic Issues    Pt is not fasting   Patient is a 37 y.o. male who presents for annual health maintenance exam.  HPI: Has been doing well.  He has made some dietary changes and exercises very well.  He has been seeing providers at [facility], getting labs followed regularly.  Has a upcoming coronary calcium score test to further determine risk.  He has high cholesterol, familial.  #7 coming down a little bit with niacin and fish oil.   Says blood pressures at [facility] have been normal.  They are usually up when he is here.  He states he is very nervous for doctors appointments here.  Most recent gout attack was in the last few months when he had tried going without his allopurinol for a while.  The joint affected was his right ankle.  He has since restarted his allopurinol and all has been well.  Past Medical History:  Diagnosis Date   Childhood asthma    Family history of prostate cancer in father    Gout    Dr. Katrinka Blazing started allopurinol 10/2018--doing great since then.   Hyperlipidemia    no meds in past.   Obesity (BMI 30.0-34.9)     Past Surgical History:  Procedure Laterality Date   CYST EXCISION  2004   Pilonidal   Family History  Problem Relation Age of Onset   Prostate cancer Father    Hearing loss Father    Hyperlipidemia Father    Hypertension Father    Hyperlipidemia Maternal Grandmother    Arthritis Paternal Grandmother    Colon cancer Paternal Grandmother    Social History   Socioeconomic History   Marital status: Married    Spouse name: Not on file   Number of children: Not on file   Years of education: Not on file   Highest education level: Bachelor's degree (e.g., BA, AB, BS)  Occupational History   Not on file  Tobacco Use   Smoking status: Never   Smokeless tobacco: Never  Vaping Use   Vaping Use: Never used   Substance and Sexual Activity   Alcohol use: Yes    Alcohol/week: 4.0 - 5.0 standard drinks of alcohol    Types: 4 - 5 Standard drinks or equivalent per week   Drug use: Never   Sexual activity: Not on file  Other Topics Concern   Not on file  Social History Narrative   Married, 1 y/o daughter as of 09/2017.   Orig from [place].   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   No tobacco.   Alcohol:   Social Determinants of Health   Financial Resource Strain: Low Risk  (05/22/2022)   Overall Financial Resource Strain (CARDIA)    Difficulty of Paying Living Expenses: Not hard at all  Food Insecurity: No Food Insecurity (05/22/2022)   Hunger Vital Sign    Worried About Running Out of Food in the Last Year: Never true    Ran Out of Food in the Last Year: Never true  Transportation Needs: No Transportation Needs (05/22/2022)   PRAPARE - Administrator, Civil Service (Medical): No    Lack of Transportation (Non-Medical): No  Physical Activity: Sufficiently Active (05/22/2022)   Exercise Vital Sign    Days of Exercise per Week: 5 days    Minutes  of Exercise per Session: 60 min  Stress: No Stress Concern Present (05/22/2022)   [employer] of Occupational Health - Occupational Stress Questionnaire    Feeling of Stress : Not at all  Social Connections: Unknown (05/22/2022)   Social Connection and Isolation Panel [NHANES]    Frequency of Communication with Friends and Family: More than three times a week    Frequency of Social Gatherings with Friends and Family: More than three times a week    Attends Religious Services: Patient declined    Database administrator or Organizations: No    Attends Engineer, structural: Not on file    Marital Status: Married    Outpatient Medications Prior to Visit  Medication Sig Dispense Refill   ibuprofen (ADVIL) 600 MG tablet Take 600 mg by mouth every 6 (six) hours as needed.     allopurinol  (ZYLOPRIM) 300 MG tablet Take 1 tablet (300 mg total) by mouth daily. MUST KEEP APPT FOR FURTHER REFILLS 14 tablet 0   colchicine 0.6 MG tablet Take 1 tablet (0.6 mg total) by mouth daily. For 3 months to prevent gout flair then take daily as needed for gout pain (Patient not taking: Reported on 09/30/2019) 30 tablet 2   indomethacin (INDOCIN) 50 MG capsule Take 1 capsule by mouth 3 times daily, as needed. (Patient not taking: Reported on 05/23/2022) 30 capsule 1   No facility-administered medications prior to visit.    No Known Allergies  Review of Systems  Constitutional:  Negative for appetite change, chills, fatigue and fever.  HENT:  Negative for congestion, dental problem, ear pain and sore throat.   Eyes:  Negative for discharge, redness and visual disturbance.  Respiratory:  Negative for cough, chest tightness, shortness of breath and wheezing.   Cardiovascular:  Negative for chest pain, palpitations and leg swelling.  Gastrointestinal:  Negative for abdominal pain, blood in stool, diarrhea, nausea and vomiting.  Genitourinary:  Negative for difficulty urinating, dysuria, flank pain, frequency, hematuria and urgency.  Musculoskeletal:  Negative for arthralgias, back pain, joint swelling, myalgias and neck stiffness.  Skin:  Negative for pallor and rash.  Neurological:  Negative for dizziness, speech difficulty, weakness and headaches.  Hematological:  Negative for adenopathy. Does not bruise/bleed easily.  Psychiatric/Behavioral:  Negative for confusion and sleep disturbance. The patient is not nervous/anxious.      PE:    05/23/2022    8:34 AM 10/05/2020   10:06 AM 09/30/2019    8:01 AM  Vitals with BMI  Height 5' 11.75" 5' 11.75" 5' 11.75"  Weight 255 lbs 6 oz 249 lbs 3 oz 246 lbs 6 oz  BMI 34.9 34.05 33.67  Systolic 159 142 161  Diastolic 92 90 84  Pulse 74 72 74   Repeat BP today 130/90 at the end of the visit.  Physical Exam  Gen: Alert, well appearing.  Patient is  oriented to person, place, time, and situation. AFFECT: pleasant, lucid thought and speech. ENT: Ears: EACs clear, normal epithelium.  TMs with good light reflex and landmarks bilaterally.  Eyes: no injection, icteris, swelling, or exudate.  EOMI, PERRLA. Nose: no drainage or turbinate edema/swelling.  No injection or focal lesion.  Mouth: lips without lesion/swelling.  Oral mucosa pink and moist.  Dentition intact and without obvious caries or gingival swelling.  Oropharynx without erythema, exudate, or swelling.  Neck: supple/nontender.  No LAD, mass, or TM.  Carotid pulses 2+ bilaterally, without bruits. CV: RRR, no m/r/g.   LUNGS:  CTA bilat, nonlabored resps, good aeration in all lung fields. ABD: soft, NT, ND, BS normal.  No hepatospenomegaly or mass.  No bruits. EXT: no clubbing, cyanosis, or edema.  Musculoskeletal: no joint swelling, erythema, warmth, or tenderness.  ROM of all joints intact. Skin - no sores or suspicious lesions or rashes or color changes   LABS:  Last CBC Lab Results  Component Value Date   WBC 4.9 10/05/2020   HGB 15.6 10/05/2020   HCT 46.4 10/05/2020   MCV 91.1 10/05/2020   RDW 12.2 10/05/2020   PLT 188.0 10/05/2020   Last metabolic panel Lab Results  Component Value Date   GLUCOSE 98 10/05/2020   NA 139 10/05/2020   K 4.8 10/05/2020   CL 103 10/05/2020   CO2 26 10/05/2020   BUN 12 10/05/2020   CREATININE 0.86 10/05/2020   CALCIUM 9.8 10/05/2020   PROT 7.4 10/05/2020   ALBUMIN 5.0 10/05/2020   BILITOT 1.0 10/05/2020   ALKPHOS 70 10/05/2020   AST 25 10/05/2020   ALT 43 10/05/2020   Last lipids Lab Results  Component Value Date   CHOL 260 (H) 09/30/2019   HDL 37.90 (L) 09/30/2019   LDLDIRECT 181.0 09/30/2019   TRIG 281.0 (H) 09/30/2019   CHOLHDL 7 09/30/2019   Last thyroid functions Lab Results  Component Value Date   TSH 1.07 09/30/2019   IMPRESSION AND PLAN:  #1 Health maintenance exam: Reviewed age and gender appropriate health  maintenance issues (prudent diet, regular exercise, health risks of tobacco and excessive alcohol, use of seatbelts, fire alarms in home, use of sunscreen).  Also reviewed age and gender appropriate health screening as well as vaccine recommendations. Vaccines: All up-to-date. Labs: None today.  He gets these followed by [facility]--we will request records. Prostate ca screening:  average risk patient= as per latest guidelines, start screening at 59 yrs of age. Colon ca screening: average risk patient= as per latest guidelines, start screening at 85 yrs of age.  #2 gout, well-controlled as long as he takes his allopurinol 300 mg daily. Refilled medication today. Will obtain labs (uric acid level) from [facility]. He recalls last uric acid at the end of last year being around 7.  #3 mixed hyperlipidemia. Niacin 1000 mg and fish oil over-the-counter, followed by a [facility]. Coronary calcium scoring has been arranged/ordered by their office and if significantly elevated it is anticipated that he will start a statin.  #4 elevated blood pressure without diagnosis of hypertension. Whitecoat syndrome.  An After Visit Summary was printed and given to the patient.  FOLLOW UP: Return in about 1 year (around 05/23/2023) for annual CPE (fasting).  Signed:  Santiago Bumpers, MD           05/23/2022

## 2022-05-23 NOTE — Patient Instructions (Signed)
Health Maintenance, Male Adopting a healthy lifestyle and getting preventive care are important in promoting health and wellness. Ask your health care provider about: The right schedule for you to have regular tests and exams. Things you can do on your own to prevent diseases and keep yourself healthy. What should I know about diet, weight, and exercise? Eat a healthy diet  Eat a diet that includes plenty of vegetables, fruits, low-fat dairy products, and lean protein. Do not eat a lot of foods that are high in solid fats, added sugars, or sodium. Maintain a healthy weight Body mass index (BMI) is a measurement that can be used to identify possible weight problems. It estimates body fat based on height and weight. Your health care provider can help determine your BMI and help you achieve or maintain a healthy weight. Get regular exercise Get regular exercise. This is one of the most important things you can do for your health. Most adults should: Exercise for at least 150 minutes each week. The exercise should increase your heart rate and make you sweat (moderate-intensity exercise). Do strengthening exercises at least twice a week. This is in addition to the moderate-intensity exercise. Spend less time sitting. Even light physical activity can be beneficial. Watch cholesterol and blood lipids Have your blood tested for lipids and cholesterol at 38 years of age, then have this test every 5 years. You may need to have your cholesterol levels checked more often if: Your lipid or cholesterol levels are high. You are older than 38 years of age. You are at high risk for heart disease. What should I know about cancer screening? Many types of cancers can be detected early and may often be prevented. Depending on your health history and family history, you may need to have cancer screening at various ages. This may include screening for: Colorectal cancer. Prostate cancer. Skin cancer. Lung  cancer. What should I know about heart disease, diabetes, and high blood pressure? Blood pressure and heart disease High blood pressure causes heart disease and increases the risk of stroke. This is more likely to develop in people who have high blood pressure readings or are overweight. Talk with your health care provider about your target blood pressure readings. Have your blood pressure checked: Every 3-5 years if you are 18-39 years of age. Every year if you are 40 years old or older. If you are between the ages of 65 and 75 and are a current or former smoker, ask your health care provider if you should have a one-time screening for abdominal aortic aneurysm (AAA). Diabetes Have regular diabetes screenings. This checks your fasting blood sugar level. Have the screening done: Once every three years after age 45 if you are at a normal weight and have a low risk for diabetes. More often and at a younger age if you are overweight or have a high risk for diabetes. What should I know about preventing infection? Hepatitis B If you have a higher risk for hepatitis B, you should be screened for this virus. Talk with your health care provider to find out if you are at risk for hepatitis B infection. Hepatitis C Blood testing is recommended for: Everyone born from 1945 through 1965. Anyone with known risk factors for hepatitis C. Sexually transmitted infections (STIs) You should be screened each year for STIs, including gonorrhea and chlamydia, if: You are sexually active and are younger than 38 years of age. You are older than 38 years of age and your   health care provider tells you that you are at risk for this type of infection. Your sexual activity has changed since you were last screened, and you are at increased risk for chlamydia or gonorrhea. Ask your health care provider if you are at risk. Ask your health care provider about whether you are at high risk for HIV. Your health care provider  may recommend a prescription medicine to help prevent HIV infection. If you choose to take medicine to prevent HIV, you should first get tested for HIV. You should then be tested every 3 months for as long as you are taking the medicine. Follow these instructions at home: Alcohol use Do not drink alcohol if your health care provider tells you not to drink. If you drink alcohol: Limit how much you have to 0-2 drinks a day. Know how much alcohol is in your drink. In the [place] one drink equals one 12 oz bottle of beer (355 mL), one 5 oz glass of wine (148 mL), or one 1 oz glass of hard liquor (44 mL). Lifestyle Do not use any products that contain nicotine or tobacco. These products include cigarettes, chewing tobacco, and vaping devices, such as e-cigarettes. If you need help quitting, ask your health care provider. Do not use street drugs. Do not share needles. Ask your health care provider for help if you need support or information about quitting drugs. General instructions Schedule regular health, dental, and eye exams. Stay current with your vaccines. Tell your health care provider if: You often feel depressed. You have ever been abused or do not feel safe at home. Summary Adopting a healthy lifestyle and getting preventive care are important in promoting health and wellness. Follow your health care provider's instructions about healthy diet, exercising, and getting tested or screened for diseases. Follow your health care provider's instructions on monitoring your cholesterol and blood pressure. This information is not intended to replace advice given to you by your health care provider. Make sure you discuss any questions you have with your health care provider. Document Revised: 05/17/2020 Document Reviewed: 05/17/2020 Elsevier Patient Education  2023 Elsevier Inc.  

## 2022-05-29 ENCOUNTER — Encounter (HOSPITAL_BASED_OUTPATIENT_CLINIC_OR_DEPARTMENT_OTHER): Payer: Self-pay | Admitting: *Deleted

## 2022-05-29 DIAGNOSIS — E785 Hyperlipidemia, unspecified: Secondary | ICD-10-CM

## 2022-05-30 ENCOUNTER — Other Ambulatory Visit (HOSPITAL_BASED_OUTPATIENT_CLINIC_OR_DEPARTMENT_OTHER): Payer: Self-pay | Admitting: Family Medicine

## 2022-05-30 DIAGNOSIS — E785 Hyperlipidemia, unspecified: Secondary | ICD-10-CM

## 2022-06-28 ENCOUNTER — Ambulatory Visit (HOSPITAL_BASED_OUTPATIENT_CLINIC_OR_DEPARTMENT_OTHER)
Admission: RE | Admit: 2022-06-28 | Discharge: 2022-06-28 | Disposition: A | Discharge status: Home / Self Care | Payer: 59 | Source: Ambulatory Visit | Attending: Family Medicine | Admitting: Family Medicine

## 2022-06-28 DIAGNOSIS — E785 Hyperlipidemia, unspecified: Secondary | ICD-10-CM | POA: Insufficient documentation

## 2022-07-12 ENCOUNTER — Encounter: Payer: Self-pay | Admitting: Family Medicine

## 2022-07-12 MED ORDER — INDOMETHACIN 50 MG PO CAPS: 50.0000 mg | ORAL_CAPSULE | Freq: Three times a day (TID) | ORAL | 3 refills | Status: AC | PRN

## 2022-07-12 NOTE — Telephone Encounter (Signed)
Rx done. 

## 2022-11-07 ENCOUNTER — Encounter: Payer: 59 | Admitting: Family Medicine

## 2023-04-20 IMAGING — CT CT RENAL STONE PROTOCOL
2 of 4 series · 17 of 46 positions shown, 19 images · non-contrast
Comparison: None.

CLINICAL DATA: Right abdominal pain

EXAM:
CT ABDOMEN AND PELVIS WITHOUT CONTRAST
TECHNIQUE: Multidetector CT imaging of the abdomen and pelvis was performed
following the standard protocol without IV contrast.

[Series 2: axial st · axial · 0.79mm/px · z∈[-480,-34]mm · 14 of 97 slices shown, 16 images]
[im 4/97  soft-tissue]
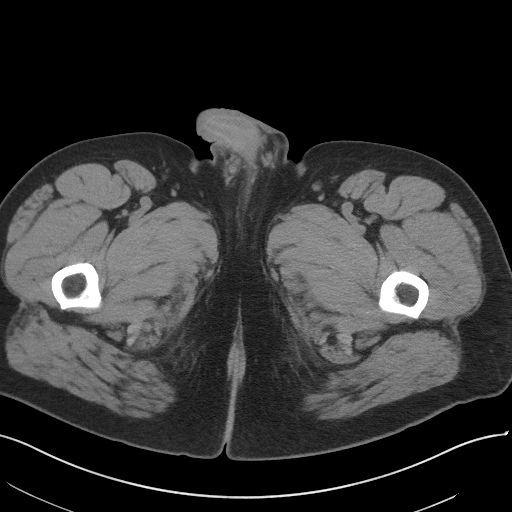
[im 4/97  bone]
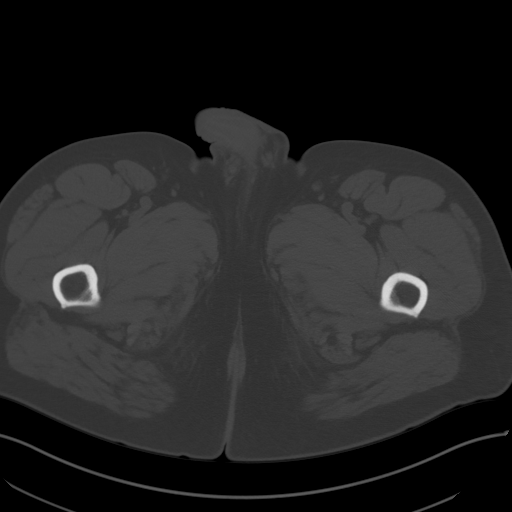
[im 12/97  soft-tissue]
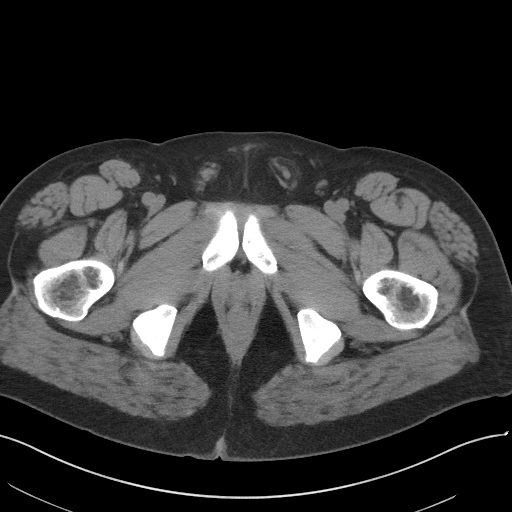
[im 20/97  soft-tissue]
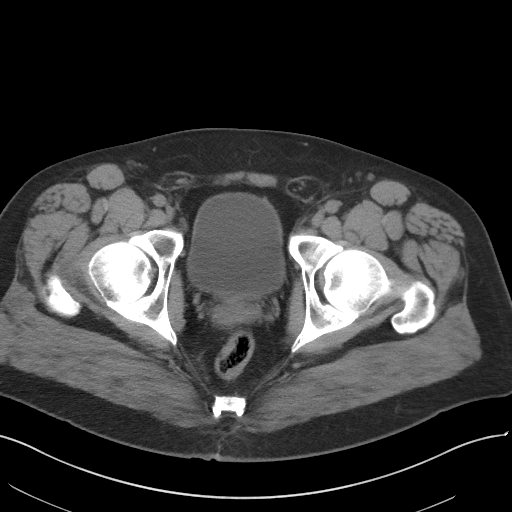
[im 27/97  soft-tissue]
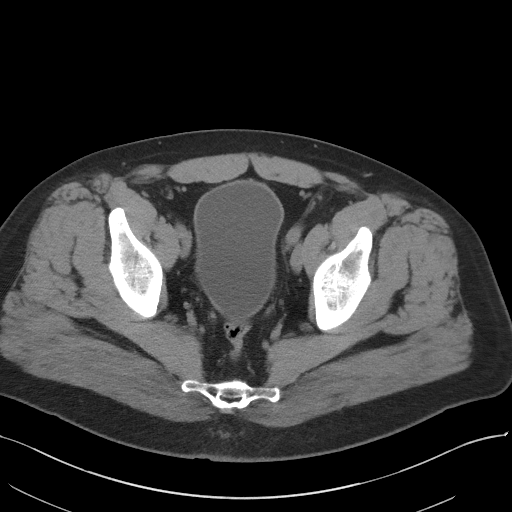
[im 31/97  soft-tissue]
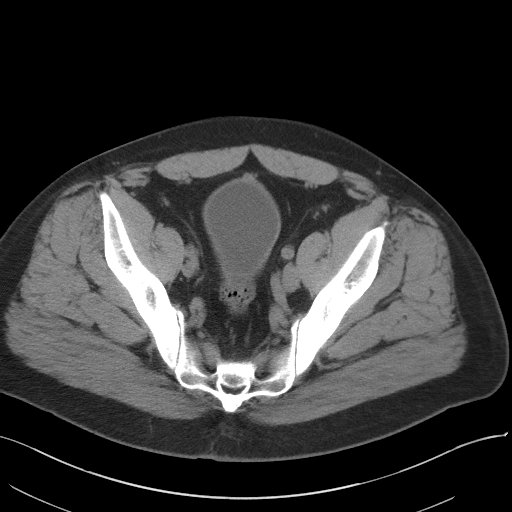
[im 39/97  soft-tissue]
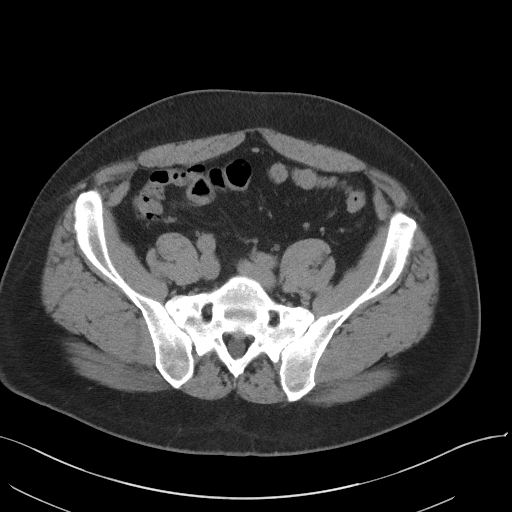
[im 47/97  soft-tissue]
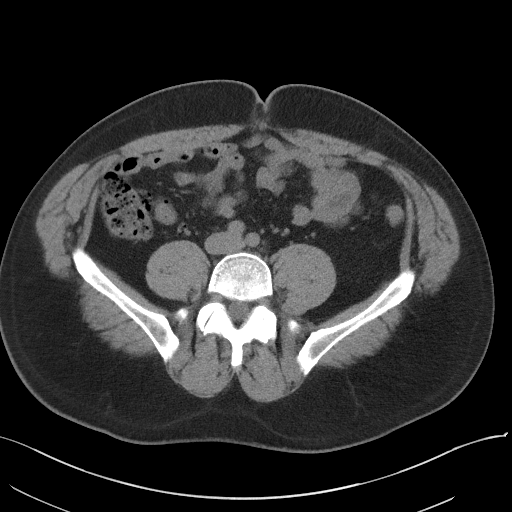
[im 50/97  soft-tissue]
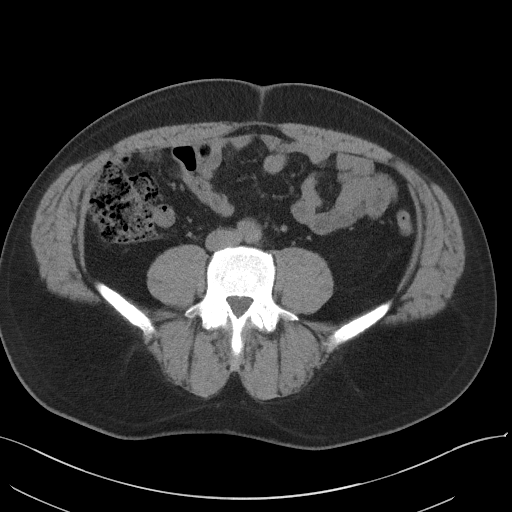
[im 58/97  soft-tissue]
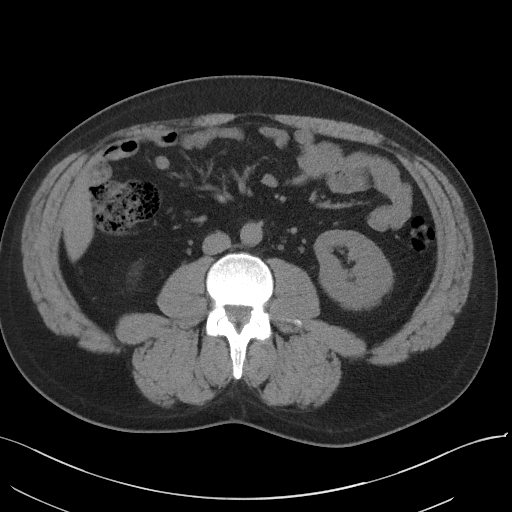
[im 58/97  bone]
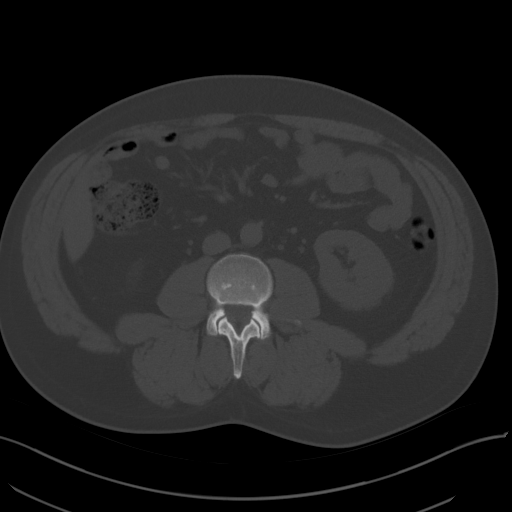
[im 66/97  soft-tissue]
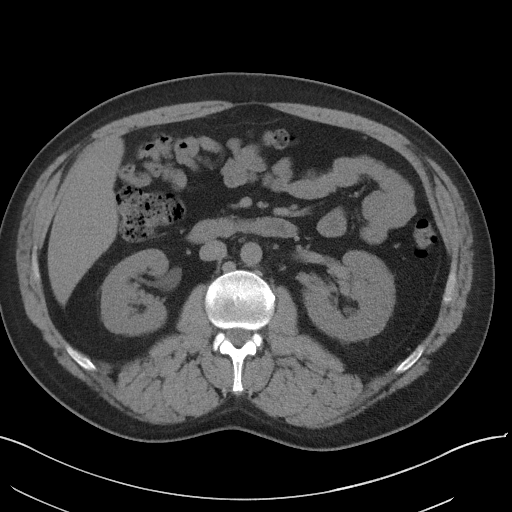
[im 73/97  soft-tissue]
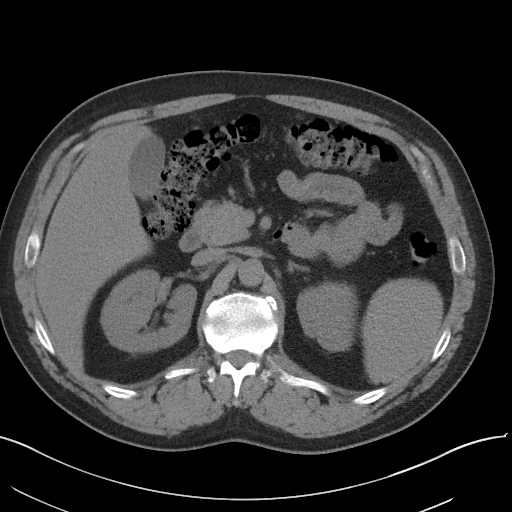
[im 77/97  soft-tissue]
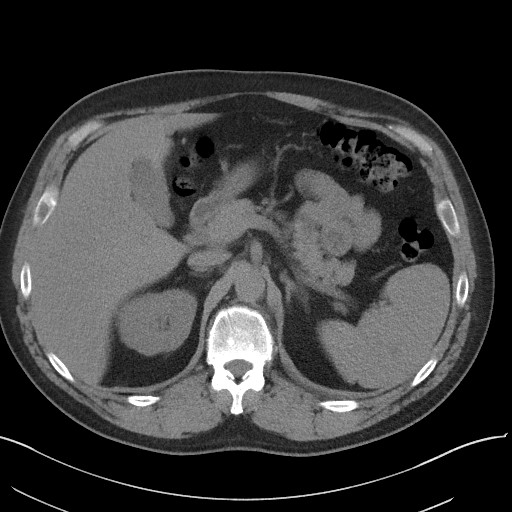
[im 85/97  soft-tissue]
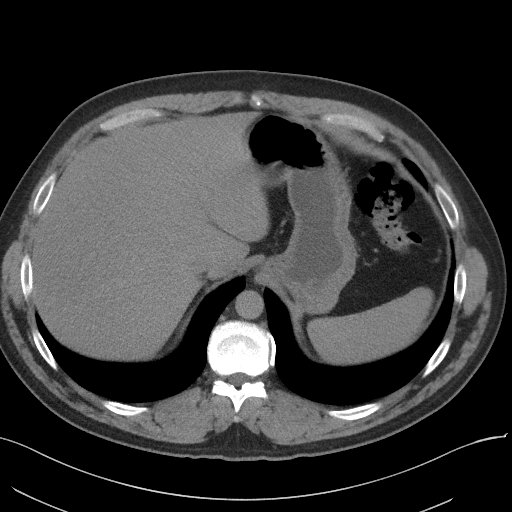
[im 93/97  soft-tissue]
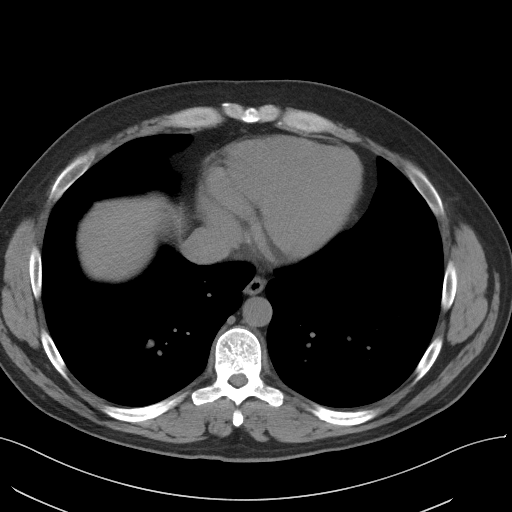

[Series 4: coronal st · coronal · 0.80mm/px · 3 of 104 slices shown]
[im 35/104  soft-tissue]
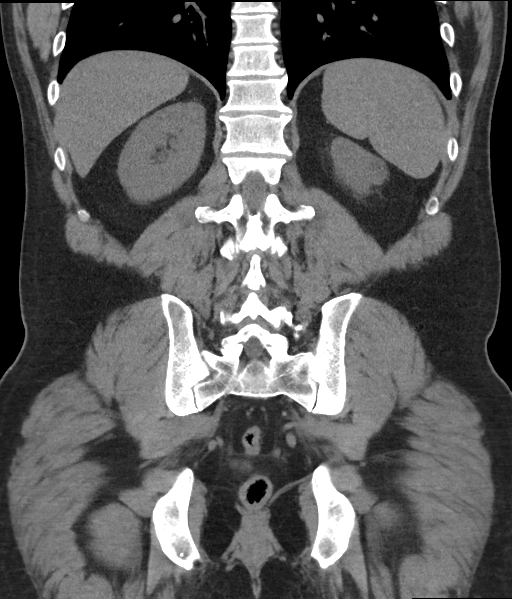
[im 46/104  soft-tissue]
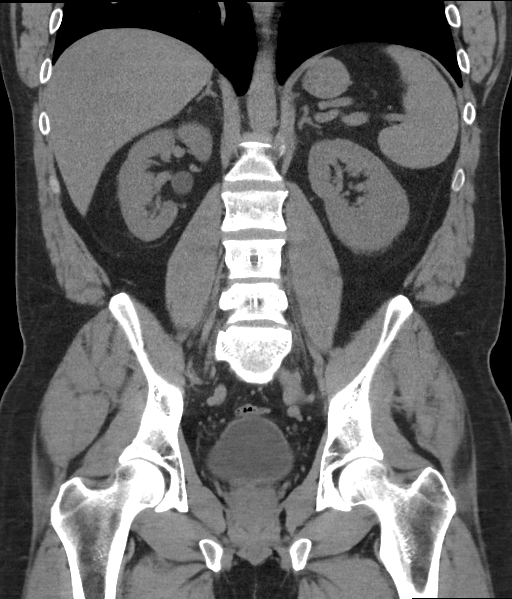
[im 58/104  soft-tissue]
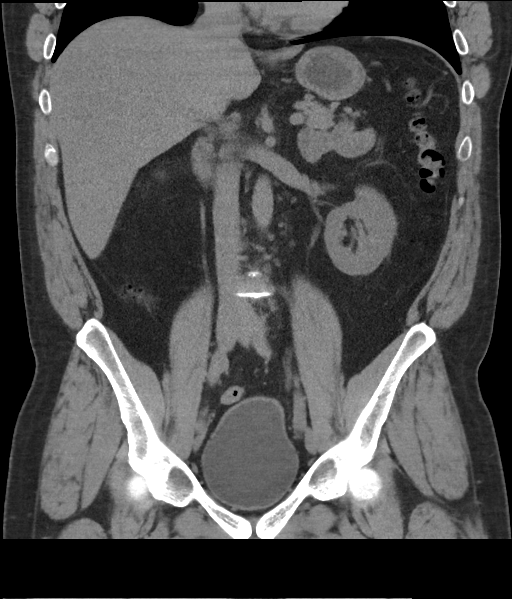

[17 of 46 positions shown; findings below may reference images not displayed]

FINDINGS: Lower chest: No acute abnormality.

Hepatobiliary: No focal liver abnormality is seen. No gallstones,
gallbladder wall thickening, or biliary dilatation.

Pancreas: Unremarkable. No pancreatic ductal dilatation or
surrounding inflammatory changes.

Spleen: Normal in size without focal abnormality.

Adrenals/Urinary Tract: Adrenal glands are unremarkable. Kidneys are
normal, without renal calculi, or hydronephrosis. Simple cyst of the
upper pole of the left kidney. Bladder is unremarkable.

Stomach/Bowel: Stomach is within normal limits. Appendix appears
normal. No evidence of bowel wall thickening, distention, or
inflammatory changes.

Vascular/Lymphatic: No significant vascular findings are present. No
enlarged abdominal or pelvic lymph nodes.

Reproductive: Prostate is unremarkable.

Other: Small fat containing inguinal hernia. No abdominopelvic
ascites.

Musculoskeletal: No acute or significant osseous findings.
IMPRESSION: No acute findings in the abdomen or pelvis, including no evidence of
obstructive uropathy.

## 2023-05-25 ENCOUNTER — Encounter: Payer: 59 | Admitting: Family Medicine

## 2023-06-09 ENCOUNTER — Other Ambulatory Visit: Payer: Self-pay | Admitting: Family Medicine

## 2023-09-25 ENCOUNTER — Encounter: Admitting: Family Medicine

## 2023-11-14 ENCOUNTER — Encounter: Admitting: Family Medicine

## 2023-12-21 ENCOUNTER — Encounter: Admitting: Family Medicine

## 2024-01-04 ENCOUNTER — Encounter: Payer: Self-pay | Admitting: Family Medicine

## 2024-01-07 ENCOUNTER — Other Ambulatory Visit: Payer: Self-pay

## 2024-01-07 MED ORDER — ALLOPURINOL 300 MG PO TABS: 300.0000 mg | ORAL_TABLET | Freq: Every day | ORAL | 0 refills | Status: AC

## 2024-01-18 ENCOUNTER — Encounter: Admitting: Family Medicine

## 2024-02-06 ENCOUNTER — Encounter: Admitting: Family Medicine

## 2024-02-22 ENCOUNTER — Encounter: Admitting: Family Medicine
